# Patient Record
Sex: Female | Born: 1980 | Race: White | Hispanic: No | Marital: Married | Smoking: Former smoker
Health system: Southern US, Community
[De-identification: ages and names within clinical notes are randomized; demographics above are authoritative.]

## PROBLEM LIST (undated history)

## (undated) ENCOUNTER — Inpatient Hospital Stay (HOSPITAL_COMMUNITY): Payer: Self-pay

## (undated) DIAGNOSIS — O09529 Supervision of elderly multigravida, unspecified trimester: Secondary | ICD-10-CM

## (undated) DIAGNOSIS — Z87442 Personal history of urinary calculi: Secondary | ICD-10-CM

## (undated) DIAGNOSIS — R87629 Unspecified abnormal cytological findings in specimens from vagina: Secondary | ICD-10-CM

## (undated) HISTORY — DX: Personal history of urinary calculi: Z87.442

## (undated) HISTORY — PX: COLPOSCOPY: SHX161

## (undated) HISTORY — PX: DILATION AND EVACUATION: SHX1459

## (undated) HISTORY — DX: Unspecified abnormal cytological findings in specimens from vagina: R87.629

---
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
## 2000-12-16 ENCOUNTER — Other Ambulatory Visit: Admission: RE | Admit: 2000-12-16 | Discharge: 2000-12-16 | Payer: Self-pay | Admitting: *Deleted

## 2002-11-17 ENCOUNTER — Emergency Department (HOSPITAL_COMMUNITY): Admission: EM | Admit: 2002-11-17 | Discharge: 2002-11-17 | Payer: Self-pay | Admitting: Emergency Medicine

## 2002-11-17 ENCOUNTER — Encounter: Payer: Self-pay | Admitting: Emergency Medicine

## 2004-09-11 ENCOUNTER — Emergency Department (HOSPITAL_COMMUNITY): Admission: EM | Admit: 2004-09-11 | Discharge: 2004-09-12 | Payer: Self-pay | Admitting: Emergency Medicine

## 2011-07-03 ENCOUNTER — Other Ambulatory Visit (HOSPITAL_COMMUNITY): Payer: Self-pay | Admitting: Gynecology

## 2011-07-03 DIAGNOSIS — Z3141 Encounter for fertility testing: Secondary | ICD-10-CM

## 2011-07-09 ENCOUNTER — Ambulatory Visit (HOSPITAL_COMMUNITY)
Admission: RE | Admit: 2011-07-09 | Discharge: 2011-07-09 | Disposition: A | Discharge status: Home / Self Care | Payer: BC Managed Care – PPO | Source: Ambulatory Visit | Attending: Gynecology | Admitting: Gynecology

## 2011-07-09 DIAGNOSIS — N979 Female infertility, unspecified: Secondary | ICD-10-CM | POA: Insufficient documentation

## 2011-07-09 DIAGNOSIS — Z3141 Encounter for fertility testing: Secondary | ICD-10-CM

## 2011-07-09 MED ORDER — IOHEXOL 300 MG/ML  SOLN: 8.0000 mL | Freq: Once | INTRAMUSCULAR | Status: AC | PRN

## 2012-10-13 LAB — OB RESULTS CONSOLE RPR: RPR: NONREACTIVE

## 2012-10-13 LAB — OB RESULTS CONSOLE RUBELLA ANTIBODY, IGM: Rubella: IMMUNE

## 2012-10-13 LAB — OB RESULTS CONSOLE GC/CHLAMYDIA
CHLAMYDIA, DNA PROBE: NEGATIVE
GC PROBE AMP, GENITAL: NEGATIVE

## 2012-10-13 LAB — OB RESULTS CONSOLE ANTIBODY SCREEN: Antibody Screen: NEGATIVE

## 2012-10-13 LAB — OB RESULTS CONSOLE ABO/RH: RH Type: POSITIVE

## 2012-10-13 LAB — OB RESULTS CONSOLE HIV ANTIBODY (ROUTINE TESTING): HIV: NONREACTIVE

## 2012-10-13 LAB — OB RESULTS CONSOLE HEPATITIS B SURFACE ANTIGEN: Hepatitis B Surface Ag: NEGATIVE

## 2013-01-13 ENCOUNTER — Other Ambulatory Visit (HOSPITAL_COMMUNITY): Payer: Self-pay | Admitting: Obstetrics & Gynecology

## 2013-01-13 DIAGNOSIS — O09812 Supervision of pregnancy resulting from assisted reproductive technology, second trimester: Secondary | ICD-10-CM

## 2013-01-16 ENCOUNTER — Ambulatory Visit (HOSPITAL_COMMUNITY)
Admission: RE | Admit: 2013-01-16 | Discharge: 2013-01-16 | Disposition: A | Discharge status: Home / Self Care | Payer: BC Managed Care – PPO | Source: Ambulatory Visit | Attending: Obstetrics & Gynecology | Admitting: Obstetrics & Gynecology

## 2013-01-16 ENCOUNTER — Ambulatory Visit (HOSPITAL_COMMUNITY): Admission: RE | Admit: 2013-01-16 | Payer: BC Managed Care – PPO | Source: Ambulatory Visit

## 2013-01-16 ENCOUNTER — Encounter (HOSPITAL_COMMUNITY): Payer: Self-pay

## 2013-01-16 DIAGNOSIS — Z363 Encounter for antenatal screening for malformations: Secondary | ICD-10-CM | POA: Insufficient documentation

## 2013-01-16 DIAGNOSIS — O358XX0 Maternal care for other (suspected) fetal abnormality and damage, not applicable or unspecified: Secondary | ICD-10-CM | POA: Insufficient documentation

## 2013-01-16 DIAGNOSIS — O09819 Supervision of pregnancy resulting from assisted reproductive technology, unspecified trimester: Secondary | ICD-10-CM | POA: Insufficient documentation

## 2013-01-16 DIAGNOSIS — Z1389 Encounter for screening for other disorder: Secondary | ICD-10-CM | POA: Insufficient documentation

## 2013-01-16 DIAGNOSIS — O09812 Supervision of pregnancy resulting from assisted reproductive technology, second trimester: Secondary | ICD-10-CM

## 2013-01-22 ENCOUNTER — Inpatient Hospital Stay (HOSPITAL_COMMUNITY)
Admission: AD | Admit: 2013-01-22 | Discharge: 2013-01-29 | DRG: 782 | Disposition: A | Discharge status: Home / Self Care | Payer: BC Managed Care – PPO | Source: Ambulatory Visit | Attending: Obstetrics and Gynecology | Admitting: Obstetrics and Gynecology

## 2013-01-22 ENCOUNTER — Encounter (HOSPITAL_COMMUNITY): Payer: Self-pay

## 2013-01-22 ENCOUNTER — Inpatient Hospital Stay (HOSPITAL_COMMUNITY): Payer: BC Managed Care – PPO

## 2013-01-22 DIAGNOSIS — O4590 Premature separation of placenta, unspecified, unspecified trimester: Principal | ICD-10-CM | POA: Diagnosis present

## 2013-01-22 DIAGNOSIS — O321XX0 Maternal care for breech presentation, not applicable or unspecified: Secondary | ICD-10-CM | POA: Diagnosis present

## 2013-01-22 DIAGNOSIS — N939 Abnormal uterine and vaginal bleeding, unspecified: Secondary | ICD-10-CM

## 2013-01-22 LAB — URINALYSIS, ROUTINE W REFLEX MICROSCOPIC
Ketones, ur: NEGATIVE mg/dL
Leukocytes, UA: NEGATIVE
Nitrite: NEGATIVE
Specific Gravity, Urine: 1.01 (ref 1.005–1.030)
pH: 7.5 (ref 5.0–8.0)

## 2013-01-22 LAB — URINE MICROSCOPIC-ADD ON

## 2013-01-22 LAB — CBC
HCT: 34.3 % — ABNORMAL LOW (ref 36.0–46.0)
Hemoglobin: 11.5 g/dL — ABNORMAL LOW (ref 12.0–15.0)
MCHC: 33.5 g/dL (ref 30.0–36.0)
Platelets: 204 10*3/uL (ref 150–400)

## 2013-01-22 LAB — TYPE AND SCREEN: Antibody Screen: NEGATIVE

## 2013-01-22 MED ORDER — ACETAMINOPHEN 325 MG PO TABS: 650.0000 mg | ORAL_TABLET | ORAL | Status: DC | PRN

## 2013-01-22 MED ORDER — BETAMETHASONE SOD PHOS & ACET 6 (3-3) MG/ML IJ SUSP
12.0000 mg | INTRAMUSCULAR | Status: AC
  Administered 2013-01-22 – 2013-01-23 (×2): 12 mg via INTRAMUSCULAR
  Filled 2013-01-22 (×2): qty 2

## 2013-01-22 MED ORDER — PRENATAL MULTIVITAMIN CH
1.0000 | ORAL_TABLET | Freq: Every day | ORAL | Status: DC
  Administered 2013-01-23 – 2013-01-29 (×7): 1 via ORAL
  Filled 2013-01-22 (×7): qty 1

## 2013-01-22 MED ORDER — ZOLPIDEM TARTRATE 5 MG PO TABS: 5.0000 mg | ORAL_TABLET | Freq: Every evening | ORAL | Status: DC | PRN

## 2013-01-22 MED ORDER — CALCIUM CARBONATE ANTACID 500 MG PO CHEW: 2.0000 | CHEWABLE_TABLET | ORAL | Status: DC | PRN

## 2013-01-22 MED ORDER — SODIUM CHLORIDE 0.9 % IJ SOLN
3.0000 mL | Freq: Two times a day (BID) | INTRAMUSCULAR | Status: DC
  Administered 2013-01-22 – 2013-01-29 (×14): 3 mL via INTRAVENOUS

## 2013-01-22 MED ORDER — DOCUSATE SODIUM 100 MG PO CAPS
100.0000 mg | ORAL_CAPSULE | Freq: Every day | ORAL | Status: DC
  Administered 2013-01-23 – 2013-01-29 (×7): 100 mg via ORAL
  Filled 2013-01-22 (×7): qty 1

## 2013-01-22 NOTE — H&P (Addendum)
HPI   32 y.o. [redacted]w[redacted]d Transferred to MAU from [facility] with painless VB. She awoke this morning with vaginal bleeding - bright red in her underwear and then had a "flow" of vaginal bleeding while on the toilet. Called 911 and was taken to [facility]. Was transported to MAU as client needs an ultrasound. Is feeling the baby move. FHT printing on fetal monitor. Pt denies contractions.  VB now just spotting.  OB History    Grav  Para  Term  Preterm  Abortions  TAB  SAB  Ect  Mult  Living    3     2  1  1          History reviewed. No pertinent past medical history.  History reviewed. No pertinent past surgical history.  History reviewed. No pertinent family history.  History   Substance Use Topics   .  Smoking status:  Never Smoker   .  Smokeless tobacco:  Not on file   .  Alcohol Use:  No    Allergies: No Known Allergies  Prescriptions prior to admission   Medication  Sig  Dispense  Refill   .  Prenatal MV-Min-Fe Fum-FA-DHA (PRENATAL+DHA PO)  Take 1 tablet by mouth daily.      Review of Systems  Constitutional: Negative for fever.  Gastrointestinal: Negative for nausea, vomiting and abdominal pain.  Genitourinary: Negative for dysuria.  Vaginal bleeding.  No vaginal leaking.   Physical Exam   Blood pressure 109/76, pulse 75, temperature 97.9 F (36.6 C), temperature source Oral, resp. rate 18, + FHT  Physical Exam  Nursing note and vitals reviewed.  Constitutional: She is oriented to person, place, and time. She appears well-developed and well-nourished. No distress.  Head: Normocephalic.  Eyes: EOM are normal.  Neck: Neck supple.  GI: Soft. There is no tenderness.  Musculoskeletal: Normal range of motion.  Neurological: She is alert and oriented to person, place, and time.  Skin: Skin is warm and dry.  Psychiatric: She has a normal mood and affect.   MAU Course      CBC done at [facility] - results reviewed. - WBC 10.0, RBC 3.92, HGB  11.9, HCT 35.4, Neutrophils 72.7, Lymphocytes 19.3   [place] - breech presentation.  cvx 4cm.  3.9cm clot noted above cervical os.     Rh + blood type  Assessment and Plan   IUP at 24+3 wks, IVF pregnancy with Marginal placental abruption.  Admit BMZ Fetal monitoring Bedrest Repeat CBC in am

## 2013-01-22 NOTE — MAU Note (Signed)
Pt presents with complaints of bright red vaginal bleeding saturating a pad that started this morning. Bleeding has since slowed down and pt states she is spotting. Denies pain, vaginal discharge and leaking of fluid

## 2013-01-22 NOTE — MAU Provider Note (Signed)
  History     CSN: [id number]  Arrival date and time: 01/22/13 1049   Seen by provider at 1115 am    Chief Complaint  Patient presents with  . Vaginal Bleeding   HPI Jennifer [name] 32 y.o. [redacted]w[redacted]d Comes to MAU after being at [facility] this AM.  She awakened this morning and had some vaginal bleeding - bright red in her underwear and then had a "flow" of vaginal bleeding while on the toilet.  Called 911 and was taken to [facility].  Was transported to MAU as client needs an ultrasound.  Is feeling the baby move.  FHT printing on fetal monitor.  Client does not report any contractions.  OB History   Grav Para Term Preterm Abortions TAB SAB Ect Mult Living   3    2 1 1          History reviewed. No pertinent past medical history.  History reviewed. No pertinent past surgical history.  History reviewed. No pertinent family history.  History  Substance Use Topics  . Smoking status: Never Smoker   . Smokeless tobacco: Not on file  . Alcohol Use: No    Allergies: No Known Allergies  Prescriptions prior to admission  Medication Sig Dispense Refill  . Prenatal MV-Min-Fe Fum-FA-DHA (PRENATAL+DHA PO) Take 1 tablet by mouth daily.        Review of Systems  Constitutional: Negative for fever.  Gastrointestinal: Negative for nausea, vomiting and abdominal pain.  Genitourinary: Negative for dysuria.       Vaginal bleeding. No vaginal leaking.   Physical Exam   Blood pressure 109/76, pulse 75, temperature 97.9 F (36.6 C), temperature source Oral, resp. rate 18, last menstrual period 08/04/2012.  Physical Exam  Nursing note and vitals reviewed. Constitutional: She is oriented to person, place, and time. She appears well-developed and well-nourished. No distress.  HENT:  Head: Normocephalic.  Eyes: EOM are normal.  Neck: Neck supple.  GI: Soft. There is no tenderness.  Musculoskeletal: Normal range of motion.  Neurological: She is alert and oriented  to person, place, and time.  Skin: Skin is warm and dry.  Psychiatric: She has a normal mood and affect.    MAU Course  Procedures  MDM 1125 Consult with Dr. Renaldo Fiddler re: plan of care  CBC done at [facility] - results reviewed.  -  WBC 10.0, RBC 3.92, HGB 11.9, HCT 35.4, Neutrophils 72.7, Lymphocytes 19.3  1252  Radiologist called  - marginal placental abruption. 1255  Dr. Renaldo Fiddler notified.  Ultrasound report reviewed.  Assessment and Plan  Marginal placental abruption.  Plan Will admit. Dr. Renaldo Fiddler to see patient this afternoon.   BURLESON,TERRI 01/22/2013, 1:02 PM

## 2013-01-23 ENCOUNTER — Inpatient Hospital Stay (HOSPITAL_COMMUNITY): Payer: BC Managed Care – PPO

## 2013-01-23 ENCOUNTER — Ambulatory Visit (HOSPITAL_COMMUNITY): Payer: BC Managed Care – PPO

## 2013-01-23 LAB — CBC
HCT: 33.5 % — ABNORMAL LOW (ref 36.0–46.0)
Hemoglobin: 11.3 g/dL — ABNORMAL LOW (ref 12.0–15.0)
MCH: 29.7 pg (ref 26.0–34.0)
MCV: 87.9 fL (ref 78.0–100.0)
RBC: 3.81 MIL/uL — ABNORMAL LOW (ref 3.87–5.11)
WBC: 15.6 10*3/uL — ABNORMAL HIGH (ref 4.0–10.5)

## 2013-01-23 LAB — ABO/RH: ABO/RH(D): A POS

## 2013-01-23 NOTE — Progress Notes (Signed)
Pt sitting up eating breakfast.

## 2013-01-23 NOTE — Consult Note (Signed)
Maternal Fetal Medicine Consultation  Requesting Provider(s): Candice Camp, MD  Reason for consultation: Marginal placental abruption  HPI: Jennifer [name] is a 32 yo G3P0020 currently at 24 4/7 weeks by IVF dates currently admitted due to episode of vaginal bleeding yesterday.  She reports some heavy bleeding yesterday AM - enough to turn the toilet water bloody - has since resolved, but continues to pass some brownish vaginal discharge without active bleeding.  She experienced some cramping initially that has since resolved.  The fetus is active.  She denies any abdominal pain or contractions now.  [name] reports some vaginal bleeding and subchorionic hemorrhage during the first trimester which resolved.  Her prenatal course is otherwise uncomplicated.  OB History: OB History   Grav Para Term Preterm Abortions TAB SAB Ect Mult Living   3    2 1 1          PMH: History reviewed. No pertinent past medical history.  PSH: History reviewed. No pertinent past surgical history.  Meds:  Scheduled Meds: . betamethasone acetate-betamethasone sodium phosphate  12 mg Intramuscular Q24H  . docusate sodium  100 mg Oral Daily  . prenatal multivitamin  1 tablet Oral Q1200  . sodium chloride  3 mL Intravenous Q12H   Continuous Infusions:  PRN Meds:.acetaminophen, calcium carbonate, zolpidem  Allergies: No Known Allergies  FH: denies family history of birth defects or hereditary disorders  Soc: denies tobacco or alcohol use during pregnancy  Review of Systems: no vaginal bleeding or cramping/contractions, no LOF, no nausea/vomiting. All other systems reviewed and are negative.  PNL: Blood type A+   PE:   Filed Vitals:   01/23/13 1239  BP:   Pulse:   Temp: 98.4 F (36.9 C)  Resp: 18    GEN: well-appearing female ABD: gravid, NT  Ultrasound: Single IUP at 24 4/7 weeks Limited ultrasound performed - no retroplacental fluid collections noted A 3.5 x 1.3 x 2.9 cm clot is noted at  the internal os - consistent with a resolving marginal placental abruption (stable since admission) Normal amniotic fluid volume TVUS - cervical length 3.9 cm without funneling or dynamic changes.  Labs: CBC    Component Value Date/Time   WBC 15.6* 01/23/2013 0555   RBC 3.81* 01/23/2013 0555   HGB 11.3* 01/23/2013 0555   HCT 33.5* 01/23/2013 0555   PLT 229 01/23/2013 0555   MCV 87.9 01/23/2013 0555   MCH 29.7 01/23/2013 0555   MCHC 33.7 01/23/2013 0555   RDW 12.9 01/23/2013 0555     A/P: 1) Single IUP at 24 4/7 weeks         2) Suspected marginal abruption - the patient denies any current, active vaginal bleeding.  Her repeat CBC has been stable.  Ultrasound findings were reviewed with the patient and expected hospital course discussed.  Recommendations: 1) Based on the degree of vaginal bleeding that the patient reports, recommend inpatient observation for at least 5-7 days without active vaginal bleeding prior to hospital discharge. 2) Concur with course of betamethasone 3) If the patient were to develop any additional bleeding, would offer NICU consult.  If any significant clinical change occurs and delivery is felt to be imminent, would also give a 12 hour course of Magnesium sulfate for neuroprotection. 4) Recommend follow up ultrasound in 2-3 weeks for interval growth and to complete anatomy.   Thank you for the opportunity to be a part of the care of Jennifer [name]. Please contact our office if we can  be of further assistance.   I spent approximately 30 minutes with this patient with over 50% of time spent in face-to-face counseling.  Alpha Gula, MD Materna-Fetal Medicine

## 2013-01-23 NOTE — Progress Notes (Addendum)
Patient ID: Jennifer [name], female   DOB: February 16, 1981, 32 y.o.   MRN: [id number] Pt without complaints GFM Brown bleeding today No Ctxs  VSSAF Hgb 11.3 (stable) FHR cat 1 No Ctxs  Abd Gravid nt   IUP at 24 4/7 Vaginal Bleeding.  Stable.  Marginal abruption Currently in BMZ series Anticipate hospitalization for 1 week after bleeding stops MFM u/s consult today DL

## 2013-01-24 LAB — CULTURE, BETA STREP (GROUP B ONLY)

## 2013-01-24 NOTE — Progress Notes (Signed)
Patient ID: Jennifer [name], female   DOB: 06-Aug-1980, 32 y.o.   MRN: [id number] S: MINIMAL SPOTTING O: AF VSS      GRAVID UTERUS NONTENDER      FHR 140'S NO DECELS NO CTXS A: IUP AT 24.5 WITH MARGINAL ABRUPTION P: CONTINUE REST

## 2013-01-25 LAB — TYPE AND SCREEN: Antibody Screen: NEGATIVE

## 2013-01-25 NOTE — Progress Notes (Signed)
Pt off the monitor after reassurring FHR  

## 2013-01-25 NOTE — Progress Notes (Signed)
24 6/7 weeks Brown vaginal spotting yesterday, none yet today No BRB/cramping  VSS Afeb Uterus soft NT  FHT + UCs none on monitor today  A: marginal abruption     S/P BMTZ series  P: continue present care

## 2013-01-25 NOTE — Progress Notes (Signed)
Pt taken off the monitor.

## 2013-01-26 NOTE — Progress Notes (Signed)
No c/o.  No VB since Sunday.  No pain.  +FM.    VSS.  AF. FHT reactive Toco flat  Gen: A&O x 3 Abd: soft, NT Ext: no c/c/e  32yo G3P0 at [redacted]w[redacted]d with marginal abruption -s/p BMZ -Plan to keep in-pt x 7 days with no active bleeding -Continue bed rest

## 2013-01-27 NOTE — Progress Notes (Signed)
No c/o. No VB since Sunday. No pain. +FM.   VSS. AF.  FHT reactive  Toco flat   Gen: A&O x 3  Abd: soft, NT  Ext: no c/c/e   32yo G3P0 at [redacted]w[redacted]d with marginal abruption  -s/p BMZ  -Plan to keep in-pt x 7 days with no active bleeding  -Continue bed rest

## 2013-01-28 LAB — TYPE AND SCREEN
ABO/RH(D): A POS
Antibody Screen: NEGATIVE

## 2013-01-28 NOTE — Progress Notes (Signed)
No c/o. No VB since Sunday. No pain. +FM.   VSS. AF.  FHT reactive  Toco flat   Gen: A&O x 3  Abd: soft, NT  Ext: no c/c/e   32yo G3P0 at [redacted]w[redacted]d with marginal abruption  -s/p BMZ  -Plan to discharge tomorrow if no active bleeding -Continue bed rest

## 2013-01-28 NOTE — Progress Notes (Signed)
Pt off the monitor after reassurring FHR  

## 2013-01-29 ENCOUNTER — Inpatient Hospital Stay (HOSPITAL_COMMUNITY): Payer: BC Managed Care – PPO

## 2013-01-29 NOTE — Progress Notes (Signed)
Discharge home with patient stating and understanding via teach back method. Pt to follow up later this week.

## 2013-01-29 NOTE — Progress Notes (Signed)
Preliminary ultrasound report shows stable clot and no new abnormalities.  Will d/c patient home with f/u next week.

## 2013-01-29 NOTE — Discharge Summary (Signed)
Obstetric Discharge Summary Reason for Admission: marginal abruption Prenatal Procedures: none Intrapartum Procedures: n/a Postpartum Procedures: none Complications-Operative and Postpartum: n/a Hemoglobin  Date Value Range Status  01/23/2013 11.3* 12.0 - 15.0 g/dL Final     HCT  Date Value Range Status  01/23/2013 33.5* 36.0 - 46.0 % Final    Physical Exam:  General: alert, cooperative and appears stated age 32: n/a Uterine Fundus: soft Incision: n/a DVT Evaluation: No evidence of DVT seen on physical exam. Negative Homan's sign. No cords or calf tenderness.  Discharge Diagnoses: marginal abruption  Discharge Information: Date: 01/29/2013 Activity: pelvic rest and bed rest Diet: routine Medications: PNV Condition: stable Instructions: refer to practice specific booklet Discharge to: home   Newborn Data: <<This patient has not yet delivered during this pregnancy.>> Home with n/a.  Jennifer [name] 01/29/2013, 5:50 PM

## 2013-01-29 NOTE — Progress Notes (Signed)
No c/o. No VB since Sunday. No pain. +FM.   VSS. AF.  FHT reactive  Toco flat   Gen: A&O x 3  Abd: soft, NT  Ext: no c/c/e   32yo G3P0 at [redacted]w[redacted]d with marginal abruption  -s/p BMZ  -Rpt U/S to look at abruption prior to D/C -If ultrasound reassuring, D/C home with bedrest and office follow-up next week -Growth and completion of 11/24 scan in 1-2 weeks

## 2013-01-30 ENCOUNTER — Encounter (HOSPITAL_COMMUNITY): Payer: Self-pay | Admitting: *Deleted

## 2013-01-30 ENCOUNTER — Inpatient Hospital Stay (HOSPITAL_COMMUNITY): Payer: BC Managed Care – PPO

## 2013-01-30 ENCOUNTER — Inpatient Hospital Stay (HOSPITAL_COMMUNITY)
Admission: AD | Admit: 2013-01-30 | Discharge: 2013-02-16 | DRG: 782 | Disposition: A | Discharge status: Home / Self Care | Payer: BC Managed Care – PPO | Source: Ambulatory Visit | Attending: Obstetrics and Gynecology | Admitting: Obstetrics and Gynecology

## 2013-01-30 DIAGNOSIS — O321XX0 Maternal care for breech presentation, not applicable or unspecified: Secondary | ICD-10-CM | POA: Diagnosis present

## 2013-01-30 DIAGNOSIS — O09819 Supervision of pregnancy resulting from assisted reproductive technology, unspecified trimester: Secondary | ICD-10-CM

## 2013-01-30 DIAGNOSIS — O4590 Premature separation of placenta, unspecified, unspecified trimester: Principal | ICD-10-CM | POA: Diagnosis present

## 2013-01-30 DIAGNOSIS — O4692 Antepartum hemorrhage, unspecified, second trimester: Secondary | ICD-10-CM

## 2013-01-30 DIAGNOSIS — O2690 Pregnancy related conditions, unspecified, unspecified trimester: Secondary | ICD-10-CM

## 2013-01-30 DIAGNOSIS — O4690 Antepartum hemorrhage, unspecified, unspecified trimester: Secondary | ICD-10-CM | POA: Diagnosis present

## 2013-01-30 LAB — CBC
HCT: 35.9 % — ABNORMAL LOW (ref 36.0–46.0)
Hemoglobin: 12.1 g/dL (ref 12.0–15.0)
MCHC: 33.7 g/dL (ref 30.0–36.0)
MCV: 88.9 fL (ref 78.0–100.0)
RDW: 13 % (ref 11.5–15.5)

## 2013-01-30 LAB — TYPE AND SCREEN
ABO/RH(D): A POS
Antibody Screen: NEGATIVE

## 2013-01-30 MED ORDER — ACETAMINOPHEN 325 MG PO TABS: 650.0000 mg | ORAL_TABLET | ORAL | Status: DC | PRN

## 2013-01-30 MED ORDER — DOCUSATE SODIUM 100 MG PO CAPS
100.0000 mg | ORAL_CAPSULE | Freq: Every day | ORAL | Status: DC
  Administered 2013-01-30 – 2013-02-16 (×18): 100 mg via ORAL
  Filled 2013-01-30 (×19): qty 1

## 2013-01-30 MED ORDER — ZOLPIDEM TARTRATE 5 MG PO TABS: 5.0000 mg | ORAL_TABLET | Freq: Every evening | ORAL | Status: DC | PRN

## 2013-01-30 MED ORDER — CALCIUM CARBONATE ANTACID 500 MG PO CHEW
2.0000 | CHEWABLE_TABLET | ORAL | Status: DC | PRN
  Filled 2013-01-30: qty 2

## 2013-01-30 MED ORDER — PRENATAL MULTIVITAMIN CH
1.0000 | ORAL_TABLET | Freq: Every day | ORAL | Status: DC
  Administered 2013-01-30 – 2013-02-16 (×18): 1 via ORAL
  Filled 2013-01-30 (×19): qty 1

## 2013-01-30 MED ORDER — LACTATED RINGERS IV SOLN
INTRAVENOUS | Status: DC
  Administered 2013-01-30 – 2013-02-01 (×6): via INTRAVENOUS

## 2013-01-30 NOTE — H&P (Signed)
Jennifer [name]  DICTATION # [id number] CSN# [id number]   Meriel Pica, MD 01/30/2013 9:41 AM

## 2013-01-30 NOTE — H&P (Signed)
NAMEZAYLEI, MULLANE NO.:  [id number]  MEDICAL RECORD NO.:  [id number]  LOCATION:  [facility]                          FACILITY:  [facility]  PHYSICIAN:  Duke Salvia. Marcelle Overlie, M.D.DATE OF BIRTH:  02-Aug-1980  DATE OF ADMISSION:  01/30/2013 DATE OF DISCHARGE:                             HISTORY & PHYSICAL   CHIEF COMPLAINT:  Vaginal bleeding.  HISTORY OF PRESENT ILLNESS:  A 32 year old, G3, P 2-1-1-0 at 24+ weeks gestation, this patient was just discharged from the hospital yesterday after being admitted on November 23 for marginal separation.  She was observed in-house after receiving betamethasone during that time, with normal cervical length.  Ultrasound showing breech presentation, with resolution of her bleeding.  She came back to MAU with complaints today of increased bleeding and is admitted now for further observation.  IV was started.  CBC type and screen, and follow up ultrasound done in MAU prior to admission.  She was evaluated by Dr. Harlon Flor from MFM last week when she was here.  PAST MEDICAL HISTORY:  Please see the [facility] form for details.  PHYSICAL EXAMINATION:  VITAL SIGNS:  Temp 98.2, blood pressure 120/72. HEENT:  Unremarkable. NECK:  Supple without masses. LUNGS:  Clear. CARDIOVASCULAR:  Regular rate and rhythm without murmurs, rubs, gallops. BREASTS:  Not examined. PELVIC:  24 cm fundal height.  The fundus was nontender.  Fetal heart rate 148.  Some dark blood from the cervix was noted by exam.  Cervix was closed. EXTREMITIES:  Unremarkable. NEUROLOGIC:  Unremarkable.  IMPRESSION: 1. A 32 plus week gestation. 2. Breech presentation. 3. In Vitro Fertilization pregnancy with marginal separation.  PLAN:  We will admit for further continuous monitoring, will plan to observe if stable, if bleeding continues, or fetal intolerance to labor, consider delivery by cesarean section.     Kimmi Acocella M. Marcelle Overlie, M.D.     RMH/MEDQ  D:  01/30/2013   T:  01/30/2013  Job:  [id number]

## 2013-01-30 NOTE — MAU Provider Note (Signed)
  History     CSN: [id number]  Arrival date and time: 01/30/13 0448   None     Chief Complaint  Patient presents with  . Vaginal Bleeding   HPI  Jennifer [name] is a 32 y.o. female G3P0020 at [redacted]w[redacted]d who presents with recurring vaginal bleeding. Pt was admitted to [facility] for bleeding last week, US showed marginal abruption. Pt was sent home yesterday and awoke this morning with bright red vaginal bleeding. She denies pain at this time. Pt has been on bed rest and pelvic rest since the start of the vaginal bleeding. Compared to the bleeding she had last week, pt describes the bleeding as a more steady stream of blood.   OB History   Grav Para Term Preterm Abortions TAB SAB Ect Mult Living   3    2 1 1          History reviewed. No pertinent past medical history.  History reviewed. No pertinent past surgical history.  History reviewed. No pertinent family history.  History  Substance Use Topics  . Smoking status: Never Smoker   . Smokeless tobacco: Not on file  . Alcohol Use: No    Allergies: No Known Allergies  Prescriptions prior to admission  Medication Sig Dispense Refill  . Prenatal MV-Min-Fe Fum-FA-DHA (PRENATAL+DHA PO) Take 1 tablet by mouth daily.       No results found for this or any previous visit (from the past 24 hour(s)).     Review of Systems  Constitutional: Negative for fever and chills.  Gastrointestinal: Negative for nausea, vomiting and abdominal pain.  Genitourinary: Negative for dysuria, urgency, frequency and hematuria.       No vaginal discharge. + vaginal bleeding. No dysuria.    Physical Exam   Blood pressure 135/66, pulse 79, temperature 98.7 F (37.1 C), temperature source Oral, resp. rate 18, height 5\' 6"  (1.676 m), weight 84.823 kg (187 lb), last menstrual period 08/04/2012, SpO2 100.00%.  Physical Exam  Constitutional: She is oriented to person, place, and time. She appears well-developed and well-nourished. No distress.  HENT:   Head: Normocephalic.  Eyes: Pupils are equal, round, and reactive to light.  Neck: Neck supple.  Respiratory: Effort normal.  GI: Soft.  Genitourinary:  Speculum exam: Vagina - Large blood clot near cervix, difficult to visualize.  Cervix -unable to visualize  Bimanual exam: Deferred  Chaperone present for exam. Large amount of bright red blood on perineum     Neurological: She is alert and oriented to person, place, and time.  Skin: Skin is warm. She is not diaphoretic. There is pallor.    Fetal Tracing: Baseline: 145 bpm  Variability: moderate  Accelerations: 10x10 Decelerations: None  Toco: No contractions      MAU Course  Procedures None  MDM CBC LR [place] Admit to [facility] per Dr. Marcelle Overlie.   Assessment and Plan   A: Vaginal bleeding in pregnancy Marginal abruption  P: Re-admit to [facility] per Dr. Kennyth Lose, NP 01/30/2013, 8:27 AM

## 2013-01-30 NOTE — MAU Note (Signed)
Pt reports she was discharged home last pm after being in the hospital x one week for episode of vaginal bleeding one week ago (abruption), on bleeding during stay in hospital but had onset of vaginal bleeding tonight.

## 2013-01-31 LAB — CBC
HCT: 32.7 % — ABNORMAL LOW (ref 36.0–46.0)
Hemoglobin: 10.9 g/dL — ABNORMAL LOW (ref 12.0–15.0)
MCH: 29.9 pg (ref 26.0–34.0)
MCHC: 33.3 g/dL (ref 30.0–36.0)
MCV: 89.6 fL (ref 78.0–100.0)
RBC: 3.65 MIL/uL — ABNORMAL LOW (ref 3.87–5.11)

## 2013-01-31 NOTE — Progress Notes (Signed)
Patient ID: Jennifer [name], female   DOB: 21-Aug-1980, 32 y.o.   MRN: [id number] Pt still reports brown bleeding today.  Much less. More spotty GFM No Ctxs  VSSAF FHR Cat 1 Ctx none  Abd Gravid nt  IUP at 25 5/7 Vaginal Bleeding/Marginal abruption - still with spotting/stable [place] clot and hgb S/P BMZ Continue hospitalization for at least 1 week after bleeding stops [place] MFM tomorrow for EFW Breech - C/S for delivery

## 2013-02-01 ENCOUNTER — Inpatient Hospital Stay (HOSPITAL_COMMUNITY): Payer: BC Managed Care – PPO

## 2013-02-01 ENCOUNTER — Ambulatory Visit (HOSPITAL_COMMUNITY): Payer: BC Managed Care – PPO

## 2013-02-01 MED ORDER — SODIUM CHLORIDE 0.9 % IJ SOLN
3.0000 mL | Freq: Two times a day (BID) | INTRAMUSCULAR | Status: DC
  Administered 2013-02-01 – 2013-02-02 (×4): 3 mL via INTRAVENOUS

## 2013-02-01 NOTE — Progress Notes (Signed)
To MFM

## 2013-02-01 NOTE — Progress Notes (Signed)
Pt denies vb - just dark brown d/c with wiping.  No pain or ctx.  + FM  AF, VSS Gen - NAD Abd - gravid, NT  A/P:  Abruption, 25+6 wks Continue bedrest, s/p BMZ [place] scheduled for today Breech - c-section if delivery indicated Ok to saline lock IV

## 2013-02-01 NOTE — Consult Note (Signed)
Jennifer [name] was discharged this past Sunday after spending one week on the antenatal unit for her first episode of vaginal bleeding from an abruption. After ~ 9 hours at home, she began bleeding again - not as heavy as before but lasted longer (~ 8-9 hours). Since her second admission, there has been dark blood on the tissue when she wipes. She denies abdominal pain and decreased fetal movement.    [place] today:  IUP at 25+6 weeks Normal interval anatomy; anatomic survey complete Normal amniotic fluid volume  Appropriate interval growth with EFW at the 51st %tile [facility] again seen over cervical area measuring 2.7 x 2.7 x 1.6 cm  Assessment: 1) Chronic abruption - second bleed; 2) IUP at 25+6 weeks; 3) Appropriate growth; 4) Frank breech presentation    Recommendations:    1) Please see Dr. Fredda Hammed note from 11/24 2) Would consider hospitalization until ~ [redacted] weeks gestation 3) Magnesium for neuropx if delivery appears imminent 4) Growth [place] in 3-4 weeks  Please call with questions or concerns.  (Face-to-face consultation with patient: 30 min)

## 2013-02-02 NOTE — Progress Notes (Signed)
Patient ID: Jennifer [name], female   DOB: 13-Dec-1980, 32 y.o.   MRN: [id number] S: MINIMAL SPOTTING NO CONTRACTIONS GOOD FETAL MOVEMENT O:  AF VSS       GRAVID UTERUS NONTENDER       FHR LAST PM 140'S REASSURING NO CONTRACTIONS       SONO FRANK BREECH NORMAL GROWTH AND AFV  CLOT NOTED AT CERVIX C/W        CHRONIC ABRUPTION A:   IUP AT 26 WEEKS       CHRONIC ABRUPTION       BREECH P: CONTINUE HOSPITALIZATION TILL 28 WEEK AT LEAST.

## 2013-02-03 LAB — TYPE AND SCREEN
ABO/RH(D): A POS
Antibody Screen: NEGATIVE

## 2013-02-03 NOTE — Progress Notes (Signed)
Patient is doing well No active bleeding No  Contractions  Afebrile VSS General alert and oriented Abdomen is soft and non tender  IMPRESSION IUP at 26 weeks and 1 day Chronic Abruption Breech  PLAN: Bedrest  Needs to schedule 1 hour PG - Patient wants to do on Dec 11

## 2013-02-04 NOTE — Progress Notes (Signed)
Patient is doing well. No vaginal bleeding. No contractions. Reports good fetal movement.  Afebrile VSS General alert and oriented Lung CTAB Car RRR Abdomen non tender  IMPRESSION: IUP at 26 weeks and 2 days Chronic Abruption Breech  PLAN: Bedrest  Schedule 1 hour PG next week

## 2013-02-05 NOTE — Progress Notes (Signed)
Patient has had a quiet weekend. No bleeding. Good Fetal movement.  Afebrile VSS General alert and oriented Abdomen is soft and non tender  IMPRESSION: IUP at 26 weeks and 3 days Chronic Abruption Breech  PLAN: Continue bedrest Patient will need 1 hour glucola  Patient was originally scheduled for glucola on Dec 11

## 2013-02-06 LAB — TYPE AND SCREEN
ABO/RH(D): A POS
Antibody Screen: NEGATIVE

## 2013-02-06 NOTE — Progress Notes (Signed)
I visited with pt while making rounds on the unit.  Jennifer [name] was in good spirits and appears to have good family support. She did not have an specific needs at this time, but she is aware of on-going availability of chaplain support.  [employer] Pager, [phone] 3:40 PM   02/06/13 1500  Clinical Encounter Type  Visited With Patient  Visit Type Spiritual support;Initial  Spiritual Encounters  Spiritual Needs Emotional

## 2013-02-06 NOTE — Progress Notes (Signed)
Ur chart review completed.  

## 2013-02-06 NOTE — Progress Notes (Signed)
No c/o.  +FM.  No VB or CTX.  VSS. Gen: A&O x 3 Abd: soft, NT/ND Ext: no c/c/e  32yo G3P0 at [redacted]w[redacted]d with chronic abruption -In-house until 28 weeks -s/p BMZ -Plan glucola 12/11 -bedrest

## 2013-02-07 NOTE — Progress Notes (Signed)
26 5/7 wks No bleeding No C/O  VSS Afeb Uterus NT FHT + accels  A: Chronic Abruption  P: Continue observation      Reevaluate at 28 wks

## 2013-02-08 NOTE — Progress Notes (Signed)
Patient ID: Jennifer [name], female   DOB: [birth year], 32 y.o.   MRN: [id number] [redacted]w[redacted]d  S//  No new c/o  O//BP 110/58  Pulse 85  Temp(Src) 98.2 F (36.8 C) (Oral)  Resp 18  Ht 5\' 6"  (1.676 m)  Wt 84.868 kg (187 lb 1.6 oz)  BMI 30.21 kg/m2  SpO2 100%  LMP 08/04/2012  Stable FHR  A+P// [redacted]w[redacted]d with marginal sep, S/P BMZ, cont obsv to 28 weeks

## 2013-02-08 NOTE — Progress Notes (Signed)
Antenatal Nutrition Assessment:  Currently  26 6/[redacted] weeks gestation, with vaginal bleeding. Height  66 "  Weight 190 lbs  pre-pregnancy weight 163 lbs .Pre-pregnancy  BMI26.3     IBW130 lbs Total weight gain 27.lbs Weight gain goals 15-25 lbs. Pt has exceeded weight gain limit at 26 weeks Estimated needs: 19-2100 kcal/day, 70-80 grams protein/day, 2.2 liters fluid/day  Antenatal Regular diet tolerated well, appetite good. Snack and retail menus offered Calcium intake on the lower side, encouraged intake of low fat dairy Current diet prescription will provide for increased needs.  No abnormal nutrition related labs   Hemoglobin & Hematocrit     Component Value Date/Time   HGB 10.9* 01/31/2013 0818   HCT 32.7* 01/31/2013 0818     Nutrition Dx: Increased nutrient needs r/t pregnancy and fetal growth requirements aeb [redacted] weeks gestation.  No educational needs assessed at this time.  Elisabeth Cara M.Odis Luster LDN Neonatal Nutrition Support Specialist Pager [phone]

## 2013-02-09 LAB — TYPE AND SCREEN: ABO/RH(D): A POS

## 2013-02-09 NOTE — Progress Notes (Signed)
Pt denies VB & ctx.  + FM  AF, VSS + FHT Abd - gravid, NT  A/P:  Marginal abruption Hospital bedrest ot 28 wks

## 2013-02-10 ENCOUNTER — Inpatient Hospital Stay (HOSPITAL_COMMUNITY): Payer: BC Managed Care – PPO

## 2013-02-10 NOTE — Progress Notes (Signed)
Pt off the monitor after reassurring FHR  

## 2013-02-10 NOTE — Progress Notes (Signed)
Patient ID: Jennifer [name], female   DOB: 12/29/1980, 32 y.o.   MRN: [id number] Pt without complaints GFM  Occas brown vag blood noted VSSAF FHR cat 1 140s Ctxs none Abd Gravid nt  IUP at 27 1/7 Marginal abruption- stable [place] today Current plan is hospitization until no bleeding x 1 week (? 28 weeks) DL

## 2013-02-11 NOTE — Progress Notes (Signed)
Pt off the monitor after reassurring FHR  

## 2013-02-11 NOTE — Progress Notes (Signed)
Pt sitting up in the bed eating breakfast  

## 2013-02-11 NOTE — Progress Notes (Signed)
Patient ID: Jennifer [name], female   DOB: [birth year], 32 y.o.   MRN: [id number] Pt without complaints GFM Occs Brown blood VSSAF FHR 140s Cat 1 Ctx none Abd Gravid nt  Marginal abruption  Stable Reassess at 28 weeks for outpt management [place] yesterday Slight decrease in clot size BPP 6/8 DL

## 2013-02-12 LAB — TYPE AND SCREEN: Antibody Screen: NEGATIVE

## 2013-02-12 NOTE — Progress Notes (Signed)
Patient ID: Jennifer [name], female   DOB: 1980/07/11, 32 y.o.   MRN: [id number] Pt without complaints, still with ocas brown spotting GFM  Occs Brown blood  VSSAF  FHR 140s Cat 1  Ctx none  Abd Gravid nt  Marginal abruption  Stable  Reassess at 28 weeks for outpt management  [place] 2 days ago Slight decrease in clot size

## 2013-02-13 NOTE — Progress Notes (Signed)
Ur chart review completed per request.  

## 2013-02-13 NOTE — Progress Notes (Signed)
27 4/7 weeks No C/O, scant brown D/C  VSS Afeb Uterus soft, NT  FHT with acels   A: marginal abruption-stable  P: Continue present management      Reevaluate at 28 weeks

## 2013-02-14 NOTE — Progress Notes (Signed)
Patient ID: Jennifer [name], female   DOB: [birth year], 32 y.o.   MRN: [id number] S: NO BLEEDING GOOD FETAL MOVEMENT O: AF VSS      GRAVID UTERUS NONTENDER      FHR CAT ONE A:  IUP AT 26.4 MARGINAL ABRUPTIION P:  CONTINUE REST

## 2013-02-15 LAB — TYPE AND SCREEN: Antibody Screen: NEGATIVE

## 2013-02-15 NOTE — Progress Notes (Signed)
S:  Patient is resting overnight. Good fetal movement. No vaginal bleeding.  O:  Afebrile VSS General alert and oriented Abdomen is soft and non tender  IMPRESSION: IUP at 27 weeks and 6 days Chronic Placental Abruption  PLAN: Possible discharge home tomorrow Recommend slowly increase activity today  If no vaginal bleeding, then I would repeat an ultrasound tomorrow prior to discharge.

## 2013-02-16 ENCOUNTER — Inpatient Hospital Stay (HOSPITAL_COMMUNITY): Payer: BC Managed Care – PPO

## 2013-02-16 NOTE — Discharge Summary (Signed)
Physician Discharge Summary  Patient ID: Jennifer [name] MRN: [id number] DOB/AGE: 1980/10/19 32 y.o.  Admit date: 01/30/2013 Discharge date: 02/16/2013  Admission Diagnoses:Vaginal bleeding, Marginal abruption   Discharge Diagnoses: same Active Problems:   Vaginal bleeding in pregnancy   Discharged Condition: stable  Hospital Course: Pt readmitted with bright red vaginal bleeding after being discharged with the same problem.  She received BMZ, and had close monitoring throughout her stay.  Now at [redacted] weeks EGA, she has not had any bleeding for the last several days and before that was only brown spotting.  [place] today is consistent with last week, with small 1.5-2 cm clot above cervix and no active bleeding.  MFM and patient all are comfortable with outpatient management of this at this time.  Consults: maternal fetal medicine  Significant Diagnostic Studies: Ultrasounds  Treatments: IVF, BMZ  Discharge Exam: Blood pressure 117/64, pulse 91, temperature 97.8 F (36.6 C), temperature source Oral, resp. rate 18, height 5\' 6"  (1.676 m), weight 86.365 kg (190 lb 6.4 oz), last menstrual period 08/04/2012, SpO2 100.00%. General appearance: alert, cooperative, appears stated age and no distress GI: soft, non-tender; bowel sounds normal; no masses,  no organomegaly  Disposition: 01-Home or Self Care  Discharge Orders   Future Orders Complete By Expires   Diet general  As directed    Discharge instructions  As directed    Comments:     Continue with modified bedrest and pelvic rest Can or return for increased bleeding Return to office in 1 week for evaluation   Increase activity slowly  As directed        Medication List         PRENATAL+DHA PO  Take 1 tablet by mouth daily.         Signed: Aronda Burford C 02/16/2013, 12:20 PM

## 2013-02-16 NOTE — Progress Notes (Signed)
Pt. Is stable and ready to be discharged home. All discharge instructions reviewed. All questions answered. Pt. Has all belongings with her. She is wheeled out via wheelchair to husbands car.

## 2013-02-16 NOTE — Progress Notes (Signed)
Patient ID: Jennifer [name], female   DOB: [birth year], 32 y.o.   MRN: [id number] Pt had a great day yesterday No spotting for last several days and had increased her activity Desires D/c home  VSSAF FHR 140s cat 1 Ctx none  No vag bleeding noted  Pt desires repeat US before home  Vaginal bleeding/ marginal abruption Stable S/P BMZ [place] today and if stable then d/c home on  Modified BR FU next week with [place] for EFW (last EFW 3 weeks ago)  DL

## 2013-04-10 LAB — OB RESULTS CONSOLE GBS: GBS: NEGATIVE

## 2013-04-28 ENCOUNTER — Encounter (HOSPITAL_COMMUNITY): Payer: Self-pay | Admitting: *Deleted

## 2013-04-28 ENCOUNTER — Telehealth (HOSPITAL_COMMUNITY): Payer: Self-pay | Admitting: *Deleted

## 2013-04-28 NOTE — Telephone Encounter (Signed)
Preadmission screen  

## 2013-05-02 ENCOUNTER — Encounter (HOSPITAL_COMMUNITY): Payer: Self-pay | Admitting: *Deleted

## 2013-05-02 ENCOUNTER — Telehealth (HOSPITAL_COMMUNITY): Payer: Self-pay | Admitting: *Deleted

## 2013-05-02 NOTE — Telephone Encounter (Signed)
Preadmission screen  

## 2013-05-04 ENCOUNTER — Inpatient Hospital Stay (HOSPITAL_COMMUNITY)
Admission: RE | Admit: 2013-05-04 | Discharge: 2013-05-10 | DRG: 766 | Disposition: A | Discharge status: Home / Self Care | Payer: PRIVATE HEALTH INSURANCE | Source: Ambulatory Visit | Attending: Obstetrics and Gynecology | Admitting: Obstetrics and Gynecology

## 2013-05-04 ENCOUNTER — Encounter (HOSPITAL_COMMUNITY): Payer: Self-pay

## 2013-05-04 DIAGNOSIS — O09819 Supervision of pregnancy resulting from assisted reproductive technology, unspecified trimester: Secondary | ICD-10-CM

## 2013-05-04 DIAGNOSIS — O324XX0 Maternal care for high head at term, not applicable or unspecified: Secondary | ICD-10-CM | POA: Diagnosis present

## 2013-05-04 DIAGNOSIS — Z87891 Personal history of nicotine dependence: Secondary | ICD-10-CM

## 2013-05-04 DIAGNOSIS — Z3490 Encounter for supervision of normal pregnancy, unspecified, unspecified trimester: Secondary | ICD-10-CM

## 2013-05-04 DIAGNOSIS — O4590 Premature separation of placenta, unspecified, unspecified trimester: Principal | ICD-10-CM | POA: Diagnosis present

## 2013-05-04 LAB — CBC
HCT: 34.5 % — ABNORMAL LOW (ref 36.0–46.0)
HEMOGLOBIN: 11.5 g/dL — AB (ref 12.0–15.0)
MCH: 29.1 pg (ref 26.0–34.0)
MCHC: 33.3 g/dL (ref 30.0–36.0)
MCV: 87.3 fL (ref 78.0–100.0)
Platelets: 222 10*3/uL (ref 150–400)
RBC: 3.95 MIL/uL (ref 3.87–5.11)
RDW: 13.1 % (ref 11.5–15.5)
WBC: 9.3 10*3/uL (ref 4.0–10.5)

## 2013-05-04 MED ORDER — FLEET ENEMA 7-19 GM/118ML RE ENEM: 1.0000 | ENEMA | RECTAL | Status: DC | PRN

## 2013-05-04 MED ORDER — CITRIC ACID-SODIUM CITRATE 334-500 MG/5ML PO SOLN
30.0000 mL | ORAL | Status: DC | PRN
  Administered 2013-05-07: 30 mL via ORAL
  Filled 2013-05-04: qty 15

## 2013-05-04 MED ORDER — IBUPROFEN 600 MG PO TABS: 600.0000 mg | ORAL_TABLET | Freq: Four times a day (QID) | ORAL | Status: DC | PRN

## 2013-05-04 MED ORDER — TERBUTALINE SULFATE 1 MG/ML IJ SOLN: 0.2500 mg | Freq: Once | INTRAMUSCULAR | Status: AC | PRN

## 2013-05-04 MED ORDER — ZOLPIDEM TARTRATE 5 MG PO TABS
5.0000 mg | ORAL_TABLET | Freq: Every evening | ORAL | Status: DC | PRN
  Administered 2013-05-04 – 2013-05-05 (×2): 5 mg via ORAL
  Filled 2013-05-04 (×2): qty 1

## 2013-05-04 MED ORDER — LACTATED RINGERS IV SOLN: 500.0000 mL | INTRAVENOUS | Status: DC | PRN

## 2013-05-04 MED ORDER — LACTATED RINGERS IV SOLN
INTRAVENOUS | Status: DC
  Administered 2013-05-04 – 2013-05-07 (×5): via INTRAVENOUS

## 2013-05-04 MED ORDER — MISOPROSTOL 25 MCG QUARTER TABLET
25.0000 ug | ORAL_TABLET | ORAL | Status: DC | PRN
  Administered 2013-05-04 – 2013-05-06 (×4): 25 ug via VAGINAL
  Filled 2013-05-04 (×4): qty 0.25

## 2013-05-04 MED ORDER — ONDANSETRON HCL 4 MG/2ML IJ SOLN
4.0000 mg | Freq: Four times a day (QID) | INTRAMUSCULAR | Status: DC | PRN
  Administered 2013-05-07: 4 mg via INTRAVENOUS
  Filled 2013-05-04: qty 2

## 2013-05-04 MED ORDER — OXYCODONE-ACETAMINOPHEN 5-325 MG PO TABS: 1.0000 | ORAL_TABLET | ORAL | Status: DC | PRN

## 2013-05-04 MED ORDER — OXYTOCIN BOLUS FROM INFUSION: 500.0000 mL | INTRAVENOUS | Status: DC

## 2013-05-04 MED ORDER — LIDOCAINE HCL (PF) 1 % IJ SOLN
30.0000 mL | INTRAMUSCULAR | Status: DC | PRN
  Filled 2013-05-04: qty 30

## 2013-05-04 MED ORDER — ACETAMINOPHEN 325 MG PO TABS
650.0000 mg | ORAL_TABLET | ORAL | Status: DC | PRN
  Administered 2013-05-07: 650 mg via ORAL
  Filled 2013-05-04: qty 2

## 2013-05-04 MED ORDER — OXYTOCIN 40 UNITS IN LACTATED RINGERS INFUSION - SIMPLE MED: 62.5000 mL/h | INTRAVENOUS | Status: DC

## 2013-05-05 LAB — RPR: RPR: NONREACTIVE

## 2013-05-05 MED ORDER — OXYTOCIN 40 UNITS IN LACTATED RINGERS INFUSION - SIMPLE MED
1.0000 m[IU]/min | INTRAVENOUS | Status: DC
  Administered 2013-05-05: 2 m[IU]/min via INTRAVENOUS
  Filled 2013-05-05: qty 1000

## 2013-05-05 MED ORDER — TERBUTALINE SULFATE 1 MG/ML IJ SOLN: 0.2500 mg | Freq: Once | INTRAMUSCULAR | Status: AC | PRN

## 2013-05-05 MED ORDER — OXYTOCIN 40 UNITS IN LACTATED RINGERS INFUSION - SIMPLE MED
1.0000 m[IU]/min | INTRAVENOUS | Status: DC
  Administered 2013-05-06: 2 m[IU]/min via INTRAVENOUS
  Filled 2013-05-05: qty 1000

## 2013-05-05 NOTE — Progress Notes (Signed)
Patient ID: Jennifer [name], female   DOB: [birth year], 33 y.o.   MRN: [id number] Pt mildly uncomfortable with ctxs Still wants to proceed with induction of labor VSSAF FHR 140s + accels Cat 1 Ctxs q 5 min and mild  Cx 1.5/50/-3  I offered the patient to dc pitocin, cytotec, then arom in am Versus continue with pitocin, versus C/S Pt elects to go with the first plan Fetus is looking good on EFM and no signs of bleeding DL

## 2013-05-05 NOTE — H&P (Signed)
Jennifer [name] is a 33 y.o. female presenting for IOL due to marginal abruption.  Was diagnosed with abruption at end of 2nd trimester with heavy vaginal bleeding and 2 hospital admissions, the second for about a month.  Has had serial US and twice weekly NSTs since and US has showed good growth, but persistence of 3 cm clot at leading edge of placenta.  Pt has had only mild spotting in last month and now presents for induction of labor.  Wants to attempt labor but knows that [name]/S may be needed if baby doesn't tolerate labor or if bleeding becomes excessive.  She again wants TOL.  GBS -.  This is IVF pregnancy. History OB History   Grav Para Term Preterm Abortions TAB SAB Ect Mult Living   3    2 1 1         Past Medical History  Diagnosis Date  . Vaginal Pap smear, abnormal   . History of kidney stones   . Newborn product of IVF pregnancy    Past Surgical History  Procedure Laterality Date  . Colposcopy    . Dilation and evacuation     Family History: family history includes Cancer in her maternal grandmother, mother, and paternal grandmother; Heart attack in her paternal grandfather and paternal grandmother; Stroke in her paternal grandfather. Social History:  reports that she quit smoking about 5 years ago. She has never used smokeless tobacco. She reports that she does not drink alcohol or use illicit drugs.   Prenatal Transfer Tool  Maternal Diabetes: No Genetic Screening: Normal Maternal Ultrasounds/Referrals: Abnormal:  Findings:   Other:Marginal abruption, has had BMZ series Fetal Ultrasounds or other Referrals:  Referred to Materal Fetal Medicine  Maternal Substance Abuse:  No Significant Maternal Medications:  None Significant Maternal Lab Results:  None Other Comments:  None  ROS  Dilation: 2 Effacement (%): 40 Station: -3 Exam by:: L.Stubbs, RN Blood pressure 124/75, pulse 87, temperature 98.4 F (36.9 [name]), temperature source Oral, resp. rate 16, height 5\' 6"  (1.676  m), weight 101.606 kg (224 lb), last menstrual period 08/04/2012. Exam Physical Exam  Prenatal labs: ABO, Rh: --/--/A POS (12/17 0810) Antibody: NEG (12/17 0810) Rubella: Immune (08/14 0000) RPR: NON REACTIVE (03/05 2015)  HBsAg: Negative (08/14 0000)  HIV: Non-reactive (08/14 0000)  GBS: Negative (02/09 0000)   Assessment/Plan: IUP at 39 weeks Stable Marginal Abruption desires TOL,IOL S/P cytotec with some response.  Int os is not dilated so will begin pitocin.  FHR Cat 1   Jennifer [name] 05/05/2013, 8:30 AM

## 2013-05-06 ENCOUNTER — Inpatient Hospital Stay (HOSPITAL_COMMUNITY): Payer: PRIVATE HEALTH INSURANCE | Admitting: Anesthesiology

## 2013-05-06 ENCOUNTER — Encounter (HOSPITAL_COMMUNITY): Payer: PRIVATE HEALTH INSURANCE | Admitting: Anesthesiology

## 2013-05-06 LAB — TYPE AND SCREEN
ABO/RH(D): A POS
ANTIBODY SCREEN: NEGATIVE

## 2013-05-06 MED ORDER — PHENYLEPHRINE 40 MCG/ML (10ML) SYRINGE FOR IV PUSH (FOR BLOOD PRESSURE SUPPORT): 80.0000 ug | PREFILLED_SYRINGE | INTRAVENOUS | Status: DC | PRN

## 2013-05-06 MED ORDER — FENTANYL 2.5 MCG/ML BUPIVACAINE 1/10 % EPIDURAL INFUSION (WH - ANES)
14.0000 mL/h | INTRAMUSCULAR | Status: DC | PRN
  Administered 2013-05-06: 14 mL/h via EPIDURAL

## 2013-05-06 MED ORDER — FENTANYL 2.5 MCG/ML BUPIVACAINE 1/10 % EPIDURAL INFUSION (WH - ANES)
INTRAMUSCULAR | Status: DC | PRN
  Administered 2013-05-06: 14 mL/h via EPIDURAL

## 2013-05-06 MED ORDER — PHENYLEPHRINE 40 MCG/ML (10ML) SYRINGE FOR IV PUSH (FOR BLOOD PRESSURE SUPPORT)
80.0000 ug | PREFILLED_SYRINGE | INTRAVENOUS | Status: DC | PRN
  Filled 2013-05-06: qty 10

## 2013-05-06 MED ORDER — LACTATED RINGERS IV SOLN: 500.0000 mL | Freq: Once | INTRAVENOUS | Status: DC

## 2013-05-06 MED ORDER — EPHEDRINE 5 MG/ML INJ
10.0000 mg | INTRAVENOUS | Status: DC | PRN
  Filled 2013-05-06: qty 4

## 2013-05-06 MED ORDER — DIPHENHYDRAMINE HCL 50 MG/ML IJ SOLN: 12.5000 mg | INTRAMUSCULAR | Status: DC | PRN

## 2013-05-06 MED ORDER — LIDOCAINE HCL (PF) 1 % IJ SOLN
INTRAMUSCULAR | Status: DC | PRN
  Administered 2013-05-06: 4 mL

## 2013-05-06 MED ORDER — FENTANYL 2.5 MCG/ML BUPIVACAINE 1/10 % EPIDURAL INFUSION (WH - ANES)
14.0000 mL/h | INTRAMUSCULAR | Status: DC | PRN
  Administered 2013-05-06: 14 mL/h via EPIDURAL
  Filled 2013-05-06 (×3): qty 125

## 2013-05-06 MED ORDER — EPHEDRINE 5 MG/ML INJ: 10.0000 mg | INTRAVENOUS | Status: DC | PRN

## 2013-05-06 NOTE — Progress Notes (Signed)
Patient ID: Jennifer [name], female   DOB: 05-01-1980, 33 y.o.   MRN: [id number] Pt without complaints VSSAF FHR 140s + accels Ctxs 1 5-7 min mild  Cx 1.5/30/-3 AROM clear  IUP at 39 + weeks AROM and pitocin  Anticipate SVD

## 2013-05-06 NOTE — Anesthesia Preprocedure Evaluation (Addendum)
Anesthesia Evaluation  Patient identified by MRN, date of birth, ID band Patient awake    Reviewed: Allergy & Precautions, H&P , NPO status , Patient's Chart, lab work & pertinent test results  Airway Mallampati: II TM Distance: >3 FB     Dental   Pulmonary former smoker,          Cardiovascular negative cardio ROS  Rhythm:Regular     Neuro/Psych negative neurological ROS  negative psych ROS   GI/Hepatic negative GI ROS, Neg liver ROS,   Endo/Other  negative endocrine ROS  Renal/GU negative Renal ROS     Musculoskeletal negative musculoskeletal ROS (+)   Abdominal (+) + obese,   Peds  Hematology negative hematology ROS (+)   Anesthesia Other Findings   Reproductive/Obstetrics (+) Pregnancy                          Anesthesia Physical Anesthesia Plan  ASA: II  Anesthesia Plan: Epidural   Post-op Pain Management:    Induction:   Airway Management Planned:   Additional Equipment:   Intra-op Plan:   Post-operative Plan:   Informed Consent: I have reviewed the patients History and Physical, chart, labs and discussed the procedure including the risks, benefits and alternatives for the proposed anesthesia with the patient or authorized representative who has indicated his/her understanding and acceptance.     Plan Discussed with:   Anesthesia Plan Comments:         Anesthesia Quick Evaluation

## 2013-05-06 NOTE — Anesthesia Procedure Notes (Signed)
Epidural Patient location during procedure: OB Start time: 05/06/2013 10:05 AM End time: 05/06/2013 10:20 AM  Staffing Anesthesiologist: Lewie LoronGERMEROTH, Laasia Arcos R Performed by: anesthesiologist   Preanesthetic Checklist Completed: patient identified, pre-op evaluation, timeout performed, IV checked, risks and benefits discussed and monitors and equipment checked  Epidural Patient position: sitting Prep: site prepped and draped and DuraPrep Patient monitoring: heart rate Approach: midline Location: L2-L3 Injection technique: LOR air and LOR saline  Needle:  Needle type: Tuohy  Needle gauge: 17 G Needle length: 9 cm Needle insertion depth: 9 cm Catheter type: closed end flexible Catheter size: 19 Gauge Catheter at skin depth: 15 cm Test dose: negative  Assessment Sensory level: T8 Events: blood not aspirated, injection not painful, no injection resistance, negative IV test and no paresthesia  Additional Notes Reason for block:procedure for pain

## 2013-05-07 ENCOUNTER — Encounter (HOSPITAL_COMMUNITY): Payer: Self-pay

## 2013-05-07 ENCOUNTER — Encounter (HOSPITAL_COMMUNITY)
Admission: RE | Disposition: A | Discharge status: Home / Self Care | Payer: Self-pay | Source: Ambulatory Visit | Attending: Obstetrics and Gynecology

## 2013-05-07 SURGERY — Surgical Case
Anesthesia: Epidural

## 2013-05-07 SURGERY — Surgical Case
Anesthesia: Regional

## 2013-05-07 MED ORDER — NALBUPHINE HCL 10 MG/ML IJ SOLN: 5.0000 mg | INTRAMUSCULAR | Status: DC | PRN

## 2013-05-07 MED ORDER — LACTATED RINGERS IV SOLN
INTRAVENOUS | Status: DC
  Administered 2013-05-07: 13:00:00 via INTRAVENOUS

## 2013-05-07 MED ORDER — PRENATAL MULTIVITAMIN CH
1.0000 | ORAL_TABLET | Freq: Every day | ORAL | Status: DC
  Administered 2013-05-08 – 2013-05-10 (×3): 1 via ORAL
  Filled 2013-05-07 (×3): qty 1

## 2013-05-07 MED ORDER — NALOXONE HCL 0.4 MG/ML IJ SOLN: 0.4000 mg | INTRAMUSCULAR | Status: DC | PRN

## 2013-05-07 MED ORDER — OXYTOCIN 40 UNITS IN LACTATED RINGERS INFUSION - SIMPLE MED: 62.5000 mL/h | INTRAVENOUS | Status: AC

## 2013-05-07 MED ORDER — LIDOCAINE-EPINEPHRINE (PF) 2 %-1:200000 IJ SOLN
INTRAMUSCULAR | Status: DC | PRN
  Administered 2013-05-07 (×2): 10 mL via EPIDURAL

## 2013-05-07 MED ORDER — MORPHINE SULFATE (PF) 0.5 MG/ML IJ SOLN
INTRAMUSCULAR | Status: DC | PRN
  Administered 2013-05-07: 4 mg via EPIDURAL

## 2013-05-07 MED ORDER — SENNOSIDES-DOCUSATE SODIUM 8.6-50 MG PO TABS
2.0000 | ORAL_TABLET | ORAL | Status: DC
  Administered 2013-05-07 – 2013-05-10 (×3): 2 via ORAL
  Filled 2013-05-07 (×3): qty 2

## 2013-05-07 MED ORDER — MENTHOL 3 MG MT LOZG: 1.0000 | LOZENGE | OROMUCOSAL | Status: DC | PRN

## 2013-05-07 MED ORDER — KETOROLAC TROMETHAMINE 60 MG/2ML IM SOLN
60.0000 mg | Freq: Once | INTRAMUSCULAR | Status: AC | PRN
  Administered 2013-05-07: 60 mg via INTRAMUSCULAR

## 2013-05-07 MED ORDER — METOCLOPRAMIDE HCL 5 MG/ML IJ SOLN: 10.0000 mg | Freq: Three times a day (TID) | INTRAMUSCULAR | Status: DC | PRN

## 2013-05-07 MED ORDER — SCOPOLAMINE 1 MG/3DAYS TD PT72
MEDICATED_PATCH | TRANSDERMAL | Status: AC
  Administered 2013-05-07: 1.5 mg via TRANSDERMAL
  Filled 2013-05-07: qty 1

## 2013-05-07 MED ORDER — SCOPOLAMINE 1 MG/3DAYS TD PT72
1.0000 | MEDICATED_PATCH | Freq: Once | TRANSDERMAL | Status: AC
  Administered 2013-05-07: 1.5 mg via TRANSDERMAL

## 2013-05-07 MED ORDER — SODIUM BICARBONATE 8.4 % IV SOLN
INTRAVENOUS | Status: AC
  Filled 2013-05-07: qty 50

## 2013-05-07 MED ORDER — SIMETHICONE 80 MG PO CHEW
80.0000 mg | CHEWABLE_TABLET | Freq: Three times a day (TID) | ORAL | Status: DC
  Administered 2013-05-07 – 2013-05-10 (×8): 80 mg via ORAL
  Filled 2013-05-07 (×8): qty 1

## 2013-05-07 MED ORDER — SODIUM CHLORIDE 0.9 % IJ SOLN: 3.0000 mL | INTRAMUSCULAR | Status: DC | PRN

## 2013-05-07 MED ORDER — ZOLPIDEM TARTRATE 5 MG PO TABS: 5.0000 mg | ORAL_TABLET | Freq: Every evening | ORAL | Status: DC | PRN

## 2013-05-07 MED ORDER — LACTATED RINGERS IV SOLN
INTRAVENOUS | Status: DC | PRN
  Administered 2013-05-07: 06:00:00 via INTRAVENOUS

## 2013-05-07 MED ORDER — ONDANSETRON HCL 4 MG/2ML IJ SOLN: 4.0000 mg | Freq: Three times a day (TID) | INTRAMUSCULAR | Status: DC | PRN

## 2013-05-07 MED ORDER — DIPHENHYDRAMINE HCL 25 MG PO CAPS: 25.0000 mg | ORAL_CAPSULE | Freq: Four times a day (QID) | ORAL | Status: DC | PRN

## 2013-05-07 MED ORDER — CHLOROPROCAINE HCL 3 % IJ SOLN
INTRAMUSCULAR | Status: AC
  Filled 2013-05-07: qty 20

## 2013-05-07 MED ORDER — MEPERIDINE HCL 25 MG/ML IJ SOLN: 6.2500 mg | INTRAMUSCULAR | Status: DC | PRN

## 2013-05-07 MED ORDER — CHLOROPROCAINE HCL 3 % IJ SOLN
INTRAMUSCULAR | Status: DC | PRN
  Administered 2013-05-07: 20 mL via EPIDURAL

## 2013-05-07 MED ORDER — LIDOCAINE-EPINEPHRINE (PF) 2 %-1:200000 IJ SOLN
INTRAMUSCULAR | Status: AC
  Filled 2013-05-07: qty 20

## 2013-05-07 MED ORDER — EPHEDRINE SULFATE 50 MG/ML IJ SOLN
INTRAMUSCULAR | Status: DC | PRN
  Administered 2013-05-07: 10 mg via INTRAVENOUS

## 2013-05-07 MED ORDER — PROMETHAZINE HCL 25 MG/ML IJ SOLN: 6.2500 mg | INTRAMUSCULAR | Status: DC | PRN

## 2013-05-07 MED ORDER — DEXTROSE 5 % IV SOLN
2.0000 g | Freq: Once | INTRAVENOUS | Status: AC
  Administered 2013-05-07: 2 g via INTRAVENOUS
  Filled 2013-05-07: qty 2

## 2013-05-07 MED ORDER — LACTATED RINGERS IV SOLN
INTRAVENOUS | Status: DC | PRN
  Administered 2013-05-07 (×3): via INTRAVENOUS

## 2013-05-07 MED ORDER — CHLOROPROCAINE HCL 3 % IJ SOLN
INTRAMUSCULAR | Status: DC | PRN
  Administered 2013-05-07: 20 mL

## 2013-05-07 MED ORDER — SIMETHICONE 80 MG PO CHEW: 80.0000 mg | CHEWABLE_TABLET | ORAL | Status: DC | PRN

## 2013-05-07 MED ORDER — EPHEDRINE 5 MG/ML INJ
INTRAVENOUS | Status: AC
  Filled 2013-05-07: qty 10

## 2013-05-07 MED ORDER — DIPHENHYDRAMINE HCL 50 MG/ML IJ SOLN: 25.0000 mg | INTRAMUSCULAR | Status: DC | PRN

## 2013-05-07 MED ORDER — ONDANSETRON HCL 4 MG/2ML IJ SOLN: 4.0000 mg | INTRAMUSCULAR | Status: DC | PRN

## 2013-05-07 MED ORDER — LANOLIN HYDROUS EX OINT: 1.0000 "application " | TOPICAL_OINTMENT | CUTANEOUS | Status: DC | PRN

## 2013-05-07 MED ORDER — TETANUS-DIPHTH-ACELL PERTUSSIS 5-2.5-18.5 LF-MCG/0.5 IM SUSP: 0.5000 mL | Freq: Once | INTRAMUSCULAR | Status: DC

## 2013-05-07 MED ORDER — SIMETHICONE 80 MG PO CHEW
80.0000 mg | CHEWABLE_TABLET | ORAL | Status: DC
  Administered 2013-05-07 – 2013-05-10 (×3): 80 mg via ORAL
  Filled 2013-05-07 (×3): qty 1

## 2013-05-07 MED ORDER — LACTATED RINGERS IV SOLN
40.0000 [IU] | INTRAVENOUS | Status: DC | PRN
  Administered 2013-05-07: 40 [IU] via INTRAVENOUS

## 2013-05-07 MED ORDER — ONDANSETRON HCL 4 MG/2ML IJ SOLN
INTRAMUSCULAR | Status: DC | PRN
  Administered 2013-05-07: 4 mg via INTRAVENOUS

## 2013-05-07 MED ORDER — ONDANSETRON HCL 4 MG PO TABS: 4.0000 mg | ORAL_TABLET | ORAL | Status: DC | PRN

## 2013-05-07 MED ORDER — HYDROMORPHONE HCL PF 1 MG/ML IJ SOLN: 0.2500 mg | INTRAMUSCULAR | Status: DC | PRN

## 2013-05-07 MED ORDER — DIPHENHYDRAMINE HCL 50 MG/ML IJ SOLN: 12.5000 mg | INTRAMUSCULAR | Status: DC | PRN

## 2013-05-07 MED ORDER — DIPHENHYDRAMINE HCL 25 MG PO CAPS: 25.0000 mg | ORAL_CAPSULE | ORAL | Status: DC | PRN

## 2013-05-07 MED ORDER — IBUPROFEN 600 MG PO TABS
600.0000 mg | ORAL_TABLET | Freq: Four times a day (QID) | ORAL | Status: DC
  Administered 2013-05-07 – 2013-05-10 (×11): 600 mg via ORAL
  Filled 2013-05-07 (×11): qty 1

## 2013-05-07 MED ORDER — NALOXONE HCL 1 MG/ML IJ SOLN
1.0000 ug/kg/h | INTRAVENOUS | Status: DC | PRN
  Filled 2013-05-07: qty 2

## 2013-05-07 MED ORDER — MORPHINE SULFATE (PF) 0.5 MG/ML IJ SOLN
INTRAMUSCULAR | Status: DC | PRN
  Administered 2013-05-07: 1 mg via INTRAVENOUS

## 2013-05-07 MED ORDER — MORPHINE SULFATE 0.5 MG/ML IJ SOLN
INTRAMUSCULAR | Status: AC
  Filled 2013-05-07: qty 10

## 2013-05-07 MED ORDER — OXYTOCIN 10 UNIT/ML IJ SOLN
INTRAMUSCULAR | Status: AC
  Filled 2013-05-07: qty 4

## 2013-05-07 MED ORDER — OXYCODONE-ACETAMINOPHEN 5-325 MG PO TABS
1.0000 | ORAL_TABLET | ORAL | Status: DC | PRN
  Administered 2013-05-07 – 2013-05-08 (×2): 1 via ORAL
  Administered 2013-05-08 – 2013-05-09 (×4): 2 via ORAL
  Administered 2013-05-10 (×3): 1 via ORAL
  Filled 2013-05-07: qty 2
  Filled 2013-05-07 (×2): qty 1
  Filled 2013-05-07 (×3): qty 2
  Filled 2013-05-07 (×3): qty 1

## 2013-05-07 MED ORDER — WITCH HAZEL-GLYCERIN EX PADS: 1.0000 "application " | MEDICATED_PAD | CUTANEOUS | Status: DC | PRN

## 2013-05-07 MED ORDER — ONDANSETRON HCL 4 MG/2ML IJ SOLN
INTRAMUSCULAR | Status: AC
  Filled 2013-05-07: qty 2

## 2013-05-07 MED ORDER — KETOROLAC TROMETHAMINE 30 MG/ML IJ SOLN
30.0000 mg | Freq: Four times a day (QID) | INTRAMUSCULAR | Status: AC | PRN
  Administered 2013-05-07: 30 mg via INTRAVENOUS
  Filled 2013-05-07: qty 1

## 2013-05-07 MED ORDER — DIBUCAINE 1 % RE OINT: 1.0000 "application " | TOPICAL_OINTMENT | RECTAL | Status: DC | PRN

## 2013-05-07 MED ORDER — KETOROLAC TROMETHAMINE 30 MG/ML IJ SOLN: 30.0000 mg | Freq: Four times a day (QID) | INTRAMUSCULAR | Status: AC | PRN

## 2013-05-07 MED ORDER — KETOROLAC TROMETHAMINE 60 MG/2ML IM SOLN
INTRAMUSCULAR | Status: AC
  Administered 2013-05-07: 60 mg via INTRAMUSCULAR
  Filled 2013-05-07: qty 2

## 2013-05-07 SURGICAL SUPPLY — 31 items
CLAMP CORD UMBIL (MISCELLANEOUS) IMPLANT
CLOTH BEACON ORANGE TIMEOUT ST (SAFETY) IMPLANT
DERMABOND ADHESIVE PROPEN (GAUZE/BANDAGES/DRESSINGS)
DERMABOND ADVANCED .7 DNX6 (GAUZE/BANDAGES/DRESSINGS) IMPLANT
DRAPE LG THREE QUARTER DISP (DRAPES) IMPLANT
DRSG OPSITE POSTOP 4X10 (GAUZE/BANDAGES/DRESSINGS) IMPLANT
DURAPREP 26ML APPLICATOR (WOUND CARE) IMPLANT
ELECT REM PT RETURN 9FT ADLT (ELECTROSURGICAL)
ELECTRODE REM PT RTRN 9FT ADLT (ELECTROSURGICAL) IMPLANT
EXTRACTOR VACUUM M CUP 4 TUBE (SUCTIONS) IMPLANT
EXTRACTOR VACUUM M CUP 4' TUBE (SUCTIONS)
GLOVE BIO SURGEON STRL SZ7 (GLOVE) IMPLANT
GOWN STRL REUS W/ TWL XL LVL3 (GOWN DISPOSABLE) IMPLANT
GOWN STRL REUS W/TWL LRG LVL3 (GOWN DISPOSABLE) IMPLANT
GOWN STRL REUS W/TWL XL LVL3 (GOWN DISPOSABLE)
KIT ABG SYR 3ML LUER SLIP (SYRINGE) IMPLANT
NEEDLE HYPO 25X5/8 SAFETYGLIDE (NEEDLE) IMPLANT
NS IRRIG 1000ML POUR BTL (IV SOLUTION) IMPLANT
PACK C SECTION WH (CUSTOM PROCEDURE TRAY) IMPLANT
PAD OB MATERNITY 4.3X12.25 (PERSONAL CARE ITEMS) IMPLANT
STAPLER VISISTAT 35W (STAPLE) IMPLANT
SUT CHROMIC 0 CTX 36 (SUTURE) IMPLANT
SUT MON AB 4-0 PS1 27 (SUTURE) IMPLANT
SUT PDS AB 0 CT 36 (SUTURE) IMPLANT
SUT PLAIN 0 NONE (SUTURE) IMPLANT
SUT PLAIN 2 0 XLH (SUTURE) IMPLANT
SUT VIC AB 3-0 CT1 27 (SUTURE)
SUT VIC AB 3-0 CT1 TAPERPNT 27 (SUTURE) IMPLANT
TOWEL OR 17X24 6PK STRL BLUE (TOWEL DISPOSABLE) IMPLANT
TRAY FOLEY CATH 14FR (SET/KITS/TRAYS/PACK) IMPLANT
WATER STERILE IRR 1000ML POUR (IV SOLUTION) IMPLANT

## 2013-05-07 SURGICAL SUPPLY — 30 items
CLAMP CORD UMBIL (MISCELLANEOUS) IMPLANT
CLOTH BEACON ORANGE TIMEOUT ST (SAFETY) ×3 IMPLANT
DRAPE LG THREE QUARTER DISP (DRAPES) IMPLANT
DRSG OPSITE POSTOP 4X10 (GAUZE/BANDAGES/DRESSINGS) ×3 IMPLANT
DURAPREP 26ML APPLICATOR (WOUND CARE) ×3 IMPLANT
ELECT REM PT RETURN 9FT ADLT (ELECTROSURGICAL) ×3
ELECTRODE REM PT RTRN 9FT ADLT (ELECTROSURGICAL) ×1 IMPLANT
EXTRACTOR VACUUM M CUP 4 TUBE (SUCTIONS) IMPLANT
EXTRACTOR VACUUM M CUP 4' TUBE (SUCTIONS)
GLOVE SURG ORTHO 8.0 STRL STRW (GLOVE) ×3 IMPLANT
GOWN STRL REUS W/ TWL XL LVL3 (GOWN DISPOSABLE) ×1 IMPLANT
GOWN STRL REUS W/TWL LRG LVL3 (GOWN DISPOSABLE) ×3 IMPLANT
GOWN STRL REUS W/TWL XL LVL3 (GOWN DISPOSABLE) ×2
KIT ABG SYR 3ML LUER SLIP (SYRINGE) ×3 IMPLANT
NEEDLE HYPO 25X5/8 SAFETYGLIDE (NEEDLE) ×3 IMPLANT
NS IRRIG 1000ML POUR BTL (IV SOLUTION) ×3 IMPLANT
PACK C SECTION WH (CUSTOM PROCEDURE TRAY) ×3 IMPLANT
PAD OB MATERNITY 4.3X12.25 (PERSONAL CARE ITEMS) ×3 IMPLANT
SPONGE LAP 18X18 X RAY DECT (DISPOSABLE) ×6 IMPLANT
STAPLER VISISTAT 35W (STAPLE) IMPLANT
SUT MNCRL 0 VIOLET CTX 36 (SUTURE) ×3 IMPLANT
SUT MONOCRYL 0 CTX 36 (SUTURE) ×6
SUT PDS AB 1 CT  36 (SUTURE)
SUT PDS AB 1 CT 36 (SUTURE) IMPLANT
SUT PLAIN 2 0 XLH (SUTURE) ×3 IMPLANT
SUT VIC AB 1 CTX 36 (SUTURE)
SUT VIC AB 1 CTX36XBRD ANBCTRL (SUTURE) IMPLANT
TOWEL OR 17X24 6PK STRL BLUE (TOWEL DISPOSABLE) ×3 IMPLANT
TRAY FOLEY CATH 14FR (SET/KITS/TRAYS/PACK) ×3 IMPLANT
WATER STERILE IRR 1000ML POUR (IV SOLUTION) ×3 IMPLANT

## 2013-05-07 NOTE — Anesthesia Postprocedure Evaluation (Signed)
  Anesthesia Post Note  Patient: Jennifer [name]  Procedure(s) Performed: Procedure(s) (LRB): PRIMARY CESAREAN SECTION (N/A)  Anesthesia type: Epidural  Patient location: PACU  Post pain: Pain level controlled  Post assessment: Post-op Vital signs reviewed  Last Vitals:  Filed Vitals:   05/07/13 0730  BP: 112/64  Pulse: 103  Temp: 36.9 C  Resp: 17    Post vital signs: Reviewed  Level of consciousness: awake  Complications: No apparent anesthesia complications

## 2013-05-07 NOTE — Anesthesia Postprocedure Evaluation (Signed)
Anesthesia Post Note  Patient: Jennifer [name]  Procedure(s) Performed: Procedure(s) (LRB): PRIMARY CESAREAN SECTION (N/A)  Anesthesia type: Epidural  Patient location: Mother/Baby  Post pain: Pain level controlled  Post assessment: Post-op Vital signs reviewed  Last Vitals:  Filed Vitals:   05/07/13 0803  BP: 113/71  Pulse: 99  Temp: 36.8 C  Resp: 16    Post vital signs: Reviewed  Level of consciousness: awake  Complications: No apparent anesthesia complications

## 2013-05-07 NOTE — Addendum Note (Signed)
Addendum created 05/07/13 1551 by Renford DillsJanet L Peityn Payton, CRNA   Modules edited: Notes Section   Notes Section:  File: [id number]

## 2013-05-07 NOTE — Addendum Note (Signed)
Addendum created 05/07/13 0948 by Jhonnie GarnerBeth M Armilda Vanderlinden, CRNA   Modules edited: Notes Section   Notes Section:  File: [id number]

## 2013-05-07 NOTE — Progress Notes (Addendum)
Patient ID: Jennifer [name], female   DOB: [birth year], 33 y.o.   MRN: [id number] Pt had been pushing for 1.5 hours with vtx now at +2 /ROP and pt now refuses to push any further due to discomfort with pushing.  Pelvis appears to be adequate based on exam but she states the discomfort with pushing is too much.  I offered increasing relief with epidural and possible vacuum, but patient requests primary C/S FHR 140s + accels Cat 1 Ctx q 3 minutes Arrest of descent Risks and benefits of C/S were discussed.  All questions were answered and informed consent was obtained.  Plan to proceed with low segment transverse Cesarean Section.

## 2013-05-07 NOTE — Transfer of Care (Signed)
Immediate Anesthesia Transfer of Care Note  Patient: Jennifer [name]  Procedure(s) Performed: Procedure(s): PRIMARY CESAREAN SECTION (N/A)  Patient Location: PACU  Anesthesia Type:Epidural  Level of Consciousness: awake  Airway & Oxygen Therapy: Patient Spontanous Breathing  Post-op Assessment: Report given to PACU RN and Post -op Vital signs reviewed and stable  Post vital signs: stable  Complications: No apparent anesthesia complications

## 2013-05-07 NOTE — Op Note (Signed)
Cesarean Section Procedure Note  Pre-operative Diagnosis: IUP at 39 weeks, marginal abruption, Arrest of descent  Post-operative Diagnosis: same  Surgeon: Turner DanielsLOWE,Dannell Gortney C   Assistants: none  Anesthesia:epidural  Procedure:  Low Segment Transverse cesarean section  Procedure Details  The patient was seen in the Holding Room. The risks, benefits, complications, treatment options, and expected outcomes were discussed with the patient.  The patient concurred with the proposed plan, giving informed consent.  The site of surgery properly noted/marked.. A Time Out was held and the above information confirmed.  After induction of anesthesia, the patient was draped and prepped in the usual sterile manner. A Pfannenstiel incision was made and carried down through the subcutaneous tissue to the fascia. Fascial incision was made and extended transversely. The fascia was separated from the underlying rectus tissue superiorly and inferiorly. The peritoneum was identified and entered. Peritoneal incision was extended longitudinally. The utero-vesical peritoneal reflection was incised transversely and the bladder flap was bluntly freed from the lower uterine segment. A low transverse uterine incision was made. Delivered from ROP Vertex presentation was a baby with Apgar scores of 9 at one minute and 9 at five minutes. After the umbilical cord was clamped and cut cord blood was obtained for evaluation. The placenta was removed intact and appeared normal. The uterine outline, tubes and ovaries appeared normal. The uterine incision was closed with running locked sutures of 0 monocryl and imbricated with 0 monocryl. Hemostasis was observed. Lavage was carried out until clear. The peritoneum was then closed with 0 monocryl and rectus muscles plicated in the midline.  After hemostasis was assured, the fascia was then reapproximated with running sutures of 0 PDS. Irrigation was applied and after adequate hemostasis was  assured, campers fascia closed with 0 plain suture and the skin was reapproximated with staples.  Instrument, sponge, and needle counts were correct prior the abdominal closure and at the conclusion of the case. The patient received 2 grams cefotetan preoperatively.  Findings: female  Estimated Blood Loss:  900cc         Specimens: Placenta was sent to pathology         Complications:  None

## 2013-05-07 NOTE — Anesthesia Postprocedure Evaluation (Signed)
  Anesthesia Post-op Note  Patient: Jennifer [name]  Procedure(s) Performed: Procedure(s): PRIMARY CESAREAN SECTION (N/A)  Patient Location: Mother/Baby  Anesthesia Type:Epidural  Level of Consciousness: awake  Airway and Oxygen Therapy: Patient Spontanous Breathing  Post-op Pain: mild  Post-op Assessment: Patient's Cardiovascular Status Stable and Respiratory Function Stable  Post-op Vital Signs: stable  Complications: No apparent anesthesia complications

## 2013-05-08 ENCOUNTER — Encounter (HOSPITAL_COMMUNITY): Payer: Self-pay | Admitting: Obstetrics and Gynecology

## 2013-05-08 LAB — CBC
HCT: 26.1 % — ABNORMAL LOW (ref 36.0–46.0)
Hemoglobin: 8.8 g/dL — ABNORMAL LOW (ref 12.0–15.0)
MCH: 29.7 pg (ref 26.0–34.0)
MCHC: 33.7 g/dL (ref 30.0–36.0)
MCV: 88.2 fL (ref 78.0–100.0)
Platelets: 166 10*3/uL (ref 150–400)
RBC: 2.96 MIL/uL — ABNORMAL LOW (ref 3.87–5.11)
RDW: 13.3 % (ref 11.5–15.5)
WBC: 12.4 10*3/uL — ABNORMAL HIGH (ref 4.0–10.5)

## 2013-05-08 NOTE — Lactation Note (Signed)
This note was copied from the chart of Boy Paulina Fusinna Buscemi. Lactation Consultation Note RN request LC assistance; baby now 3126 hours old and has not had a feeding since birth. Mom attempting to feed baby now, but baby sound asleep at the breast.  Unable to wake baby for a feeding. Mom has been pumping, and has not gotten any milk out. Attempted to hand express with mom, no colostrum expressed.   Discussed with mom and dad the need to supplement baby at this time. Discussed options to supplement while protecting breastfeeding. Recommended using a nipple shield to stimulate baby's suck reflex and squirt formula into the nipple shield as baby sucks. Mom's nipples are everted, but baby just does not have enough strength to maintain his suction without the shield.   Nipple shield initiated, 12 mL formula squirted as baby sucked, baby took the full volume. Dad and MGM at bedside to learn the technique. Dad states he will be able to assist mom to feed baby with this method.   Written feeding plan provided: frequent STS and cue based feeding, at least 8 feeds per day. Attempt to breast feed. If no latch, place nipple shield and fill with pumped breast milk if available, or formula.  Pump 8 times per day and hand express after pumping.  Call for help if any concerns.  Mom and dad reassured that this is likely a temporary issue and that baby may start feeding better once he wakes up. Inst mom and dad that the feeding plan will change as the needs of the baby change.   Mom very tired after we fed baby. Inappropriate time to give too much education. Enc mom to review Baby and Me book breastfeeding basics when she has a chance. Lactation brochure provided, community resources provided, mom made aware of lactation services and BFSG. Mom enc to call if she has any concerns.   Mom and dad very pleased to get baby fed; they are still committed to breastfeeding.   Patient Name: Boy Paulina Fusinna Burciaga NWGNF'AToday's Date:  05/08/2013 Reason for consult: Initial assessment   Maternal Data Formula Feeding for Exclusion: No Has patient been taught Hand Expression?: Yes Does the patient have breastfeeding experience prior to this delivery?: No  Feeding Feeding Type: Breast Fed Length of feed: 15 min  LATCH Score/Interventions Latch: Repeated attempts needed to sustain latch, nipple held in mouth throughout feeding, stimulation needed to elicit sucking reflex.  Audible Swallowing: Spontaneous and intermittent (squirting formula into nipple shield)  Type of Nipple: Everted at rest and after stimulation  Comfort (Breast/Nipple): Soft / non-tender     Hold (Positioning): Assistance needed to correctly position infant at breast and maintain latch.  LATCH Score: 8  Lactation Tools Discussed/Used Tools: Nipple Shields (syringe) Nipple shield size: 24   Consult Status Consult Status: Follow-up Follow-up type: In-patient    Octavio MannsSanders, Kingstyn Deruiter [facility] 05/08/2013, 11:27 AM

## 2013-05-08 NOTE — Progress Notes (Signed)
Subjective: Postpartum Day 1: Cesarean Delivery Patient reports tolerating PO, + flatus and no problems voiding.    Objective: Vital signs in last 24 hours: Temp:  [97.9 F (36.6 C)-99.1 F (37.3 C)] 98.2 F (36.8 C) (03/09 0554) Pulse Rate:  [80-111] 82 (03/09 0554) Resp:  [16-18] 18 (03/09 0554) BP: (93-116)/(54-72) 95/54 mmHg (03/09 0554) SpO2:  [94 %-98 %] 97 % (03/09 0554)  Physical Exam:  General: alert and cooperative Lochia: appropriate Uterine Fundus: firm Incision: honeycomb dressing CDI DVT Evaluation: No evidence of DVT seen on physical exam. Negative Homan's sign. No cords or calf tenderness. Calf/Ankle edema is present.   Recent Labs  05/08/13 0555  HGB 8.8*  HCT 26.1*    Assessment/Plan: Status post Cesarean section. Doing well postoperatively.  Continue current care.  CURTIS,CAROL G 05/08/2013, 8:25 AM

## 2013-05-09 MED ORDER — FERROUS SULFATE 325 (65 FE) MG PO TABS
325.0000 mg | ORAL_TABLET | Freq: Three times a day (TID) | ORAL | Status: DC
  Administered 2013-05-09 – 2013-05-10 (×3): 325 mg via ORAL
  Filled 2013-05-09 (×3): qty 1

## 2013-05-09 NOTE — Lactation Note (Signed)
This note was copied from the chart of Jennifer [name]. Lactation Consultation Note Follow up visit at 65 hours of age.  Mom reports last feeding 1 1/2 hr ago and baby asleep in crib.  Prior feeding baby remained latched at breast for about 10 minutes.  Parents preload nipple shield with formula due to poor latch yesterday.  Instructed mom again on nipple shield use, she was not applying well.  AAP supplementation guidelines given and instructed on use for babys age.  Parents are happy with this feeding plan at this time and understand it to be temporary.  Mom is not pumping regularly and not getting colostrum.  Mom feels full and has easily expressed colostrum.  Collected about 3 mls for next feeding.  Encouraged to feed with nipple shield and supplementation, pump on preemie mode and hand express after and to wake baby for feedings every 2 1/2 hours or with feeding cues. Demonstrated waking techniques and ssisted baby to latch in cross cradle without nipple shield baby latches with few sucks, but shallow. Used nipple shield on right breast in football hold.  Mom more independent with this latch.  Encouraged mom to call for latch assist tonight for help prior to possible discharge in the morning.  LC to follow tomorrow with evaluation for pending discharge plan.  [facility].     Patient Name: Jennifer [name] ZOXWR'UToday's Date: 05/09/2013 Reason for consult: Follow-up assessment;Difficult latch   Maternal Data    Feeding Feeding Type: Breast Fed Length of feed:  (about 10 minutes over about 25)  LATCH Score/Interventions Latch: Grasps breast easily, tongue down, lips flanged, rhythmical sucking. Intervention(s): Adjust position;Assist with latch;Breast compression  Audible Swallowing: A few with stimulation Intervention(s): Skin to skin;Hand expression  Type of Nipple: Everted at rest and after stimulation  Comfort (Breast/Nipple): Soft / non-tender     Hold (Positioning):  Assistance needed to correctly position infant at breast and maintain latch. Intervention(s): Breastfeeding basics reviewed;Support Pillows;Position options;Skin to skin  LATCH Score: 8  Lactation Tools Discussed/Used Tools: Nipple Shields Nipple shield size: 24   Consult Status Consult Status: Follow-up Date: 05/10/13 Follow-up type: In-patient    Jennifer [name], Jennifer [name] 05/09/2013, 11:15 PM

## 2013-05-09 NOTE — Progress Notes (Signed)
Subjective: Postpartum Day 2: Cesarean Delivery Patient reports tolerating PO, + flatus and no problems voiding.    Objective: Vital signs in last 24 hours: Temp:  [98 F (36.7 C)-98.2 F (36.8 C)] 98.2 F (36.8 C) (03/10 0528) Pulse Rate:  [78] 78 (03/10 0528) Resp:  [18] 18 (03/10 0528) BP: (105-106)/(66-68) 105/66 mmHg (03/10 0528)  Physical Exam:  General: alert and cooperative Lochia: appropriate Uterine Fundus: firm Incision: honeycomb dressing CDI DVT Evaluation: No evidence of DVT seen on physical exam. Negative Homan's sign. No cords or calf tenderness.   Recent Labs  05/08/13 0555  HGB 8.8*  HCT 26.1*    Assessment/Plan: Status post Cesarean section. Doing well postoperatively.  Continue current care.  Jennifer [name] G 05/09/2013, 8:47 AM

## 2013-05-10 MED ORDER — FERROUS SULFATE 325 (65 FE) MG PO TABS: 325.0000 mg | ORAL_TABLET | Freq: Three times a day (TID) | ORAL | Status: DC

## 2013-05-10 MED ORDER — OXYCODONE-ACETAMINOPHEN 5-325 MG PO TABS: 1.0000 | ORAL_TABLET | ORAL | Status: DC | PRN

## 2013-05-10 MED ORDER — IBUPROFEN 600 MG PO TABS: 600.0000 mg | ORAL_TABLET | Freq: Four times a day (QID) | ORAL | Status: DC

## 2013-05-10 NOTE — Lactation Note (Signed)
This note was copied from the chart of Jennifer Paulina Fusinna Guyton. Lactation Consultation Note Follow up consult:  Mother recently formula fed baby 36 ml and then breastfed for 10 minutes with NS.  Baby asleep.  Encouraged mother to breastfeed first and offer supplement after. Mother's breasts are filling and mother plans to pump after feeding.   Reviewed supply and demand, Baby & Me booklet pp 20-24, voids/stools, milk storage and engorgement care.  Left LC phone number and encouraged parents to call for assistance with next breastfeeding.   Patient Name: Jennifer [name] ZOXWR'UToday's Date: 05/10/2013 Reason for consult: Follow-up assessment   Maternal Data    Feeding Feeding Type: Breast Fed Length of feed: 10 min  LATCH Score/Interventions Latch: Grasps breast easily, tongue down, lips flanged, rhythmical sucking. Intervention(s): Skin to skin (using nipple shields and prefilling with formula)  Audible Swallowing: Spontaneous and intermittent  Type of Nipple: Everted at rest and after stimulation  Comfort (Breast/Nipple): Filling, red/small blisters or bruises, mild/mod discomfort  Problem noted: Mild/Moderate discomfort Interventions (Mild/moderate discomfort): Hand expression;Post-pump  Hold (Positioning): Assistance needed to correctly position infant at breast and maintain latch.  LATCH Score: 8  Lactation Tools Discussed/Used     Consult Status Consult Status: Follow-up Date: 05/11/13 Follow-up type: In-patient    Jennifer [name], Jennifer [name] 05/10/2013, 9:02 AM

## 2013-05-10 NOTE — Progress Notes (Signed)
Post discharge chart review completed.  

## 2013-05-10 NOTE — Lactation Note (Signed)
This note was copied from the chart of Jennifer [name] Castner. Lactation Consultation Note Follow up consult:  Baby 6178 hours old.  Mother able to 10 ml of colostrum after last feeding.     Mother's breast are filling.  Mother able to hand express drops of breastmilk. Assisted mother to place baby in football hold and achieve a deep latch. Rhythmical sucking and swallows observed.  LS 8 due to repostioning and breasts filling. Baby breastfed for 15 minutes on right breast, burped and fed 20 min on left breast.  Reviewed engorgement care. Plan is to view one more feeding to make sure mother is confident latching the baby without the nipple shield.   Reviewed voids/stools and milk storage. Parents are to call to view next feeding.       Patient Name: Jennifer [name] Mcgregor ZOXWR'UToday's Date: 05/10/2013 Reason for consult: Follow-up assessment   Maternal Data    Feeding Feeding Type: Breast Fed Length of feed: 35 min  LATCH Score/Interventions Latch: Grasps breast easily, tongue down, lips flanged, rhythmical sucking. Intervention(s): Waking techniques Intervention(s): Adjust position;Assist with latch;Breast massage  Audible Swallowing: Spontaneous and intermittent Intervention(s): Hand expression  Type of Nipple: Everted at rest and after stimulation  Comfort (Breast/Nipple): Filling, red/small blisters or bruises, mild/mod discomfort  Problem noted: Filling;Mild/Moderate discomfort Interventions (Filling): Frequent nursing  Hold (Positioning): Assistance needed to correctly position infant at breast and maintain latch.  LATCH Score: 8  Lactation Tools Discussed/Used     Consult Status Consult Status: PRN Date: 05/11/13 Follow-up type: In-patient    Dahlia ByesBerkelhammer, Ruth [facility] 05/10/2013, 12:04 PM

## 2013-05-10 NOTE — Lactation Note (Signed)
This note was copied from the chart of Jennifer Paulina Fusinna Setterlund. Lactation Consultation Note Follow up consult:  Parents called to view latch. LS9. Mother placed baby in football hold without NS, no longer using. Sucks and swallows observed.  Breastfed for 20 min. Encouraged deep latch. Mother is engorged, provided ice and a hand pump and reviewed use. Reviewed engorgement care and lactation support services.   Patient Name: Jennifer [name] Reason for consult: Follow-up assessment   Maternal Data    Feeding Feeding Type: Breast Fed Length of feed: 20 min  LATCH Score/Interventions Latch: Grasps breast easily, tongue down, lips flanged, rhythmical sucking. Intervention(s): Skin to skin Intervention(s): Adjust position;Assist with latch;Breast massage  Audible Swallowing: Spontaneous and intermittent Intervention(s): Hand expression  Type of Nipple: Everted at rest and after stimulation  Comfort (Breast/Nipple): Filling, red/small blisters or bruises, mild/mod discomfort  Problem noted: Mild/Moderate discomfort Interventions (Filling): Frequent nursing (ice)  Hold (Positioning): No assistance needed to correctly position infant at breast.  LATCH Score: 9  Lactation Tools Discussed/Used     Consult Status Consult Status: Complete Date: 05/11/13 Follow-up type: In-patient    Jennifer [name], Jennifer [name] [facility] [name], 1:26 PM

## 2013-05-10 NOTE — Discharge Summary (Signed)
Obstetric Discharge Summary Reason for Admission: induction of labor Prenatal Procedures: ultrasound Intrapartum Procedures: cesarean: low cervical, transverse Postpartum Procedures: none Complications-Operative and Postpartum: none Hemoglobin  Date Value Ref Range Status  05/08/2013 8.8* 12.0 - 15.0 g/dL Final     HCT  Date Value Ref Range Status  05/08/2013 26.1* 36.0 - 46.0 % Final    Physical Exam:  General: alert and cooperative Lochia: appropriate Uterine Fundus: firm Incision: healing well DVT Evaluation: No evidence of DVT seen on physical exam. Negative Homan's sign. No cords or calf tenderness. Calf/Ankle edema is present.  Discharge Diagnoses: Term Pregnancy-delivered  Discharge Information: Date: 05/10/2013 Activity: pelvic rest Diet: routine Medications: PNV, Ibuprofen and Percocet Condition: stable Instructions: refer to practice specific booklet Discharge to: home   Newborn Data: Live born female  Birth Weight: 7 lb 12 oz (3515 g) APGAR: 9, 9  Home with mother.  Maia Handa G 05/10/2013, 8:55 AM

## 2013-06-20 ENCOUNTER — Other Ambulatory Visit: Payer: Self-pay | Admitting: Obstetrics and Gynecology

## 2013-12-26 ENCOUNTER — Other Ambulatory Visit: Payer: Self-pay | Admitting: Obstetrics and Gynecology

## 2013-12-27 LAB — CYTOLOGY - PAP

## 2014-01-01 ENCOUNTER — Encounter (HOSPITAL_COMMUNITY): Payer: Self-pay | Admitting: Obstetrics and Gynecology

## 2014-03-15 ENCOUNTER — Encounter (HOSPITAL_COMMUNITY): Payer: Self-pay | Admitting: Obstetrics and Gynecology

## 2014-08-02 ENCOUNTER — Other Ambulatory Visit: Payer: Self-pay | Admitting: Obstetrics and Gynecology

## 2014-08-03 LAB — CYTOLOGY - PAP

## 2014-08-09 ENCOUNTER — Encounter (HOSPITAL_COMMUNITY): Payer: Self-pay | Admitting: Obstetrics and Gynecology

## 2015-09-12 ENCOUNTER — Encounter: Payer: Self-pay | Admitting: Obstetrics & Gynecology

## 2015-09-23 LAB — OB RESULTS CONSOLE GC/CHLAMYDIA
Chlamydia: NEGATIVE
Gonorrhea: NEGATIVE

## 2015-09-23 LAB — OB RESULTS CONSOLE HEPATITIS B SURFACE ANTIGEN: HEP B S AG: NEGATIVE

## 2015-09-23 LAB — OB RESULTS CONSOLE RPR: RPR: NONREACTIVE

## 2015-09-23 LAB — OB RESULTS CONSOLE RUBELLA ANTIBODY, IGM: RUBELLA: IMMUNE

## 2015-09-23 LAB — OB RESULTS CONSOLE HIV ANTIBODY (ROUTINE TESTING): HIV: NONREACTIVE

## 2015-09-23 LAB — OB RESULTS CONSOLE ABO/RH: RH Type: POSITIVE

## 2015-09-23 LAB — OB RESULTS CONSOLE ANTIBODY SCREEN: ANTIBODY SCREEN: NEGATIVE

## 2015-12-26 ENCOUNTER — Other Ambulatory Visit (HOSPITAL_COMMUNITY): Payer: Self-pay | Admitting: Obstetrics & Gynecology

## 2015-12-26 DIAGNOSIS — Q270 Congenital absence and hypoplasia of umbilical artery: Secondary | ICD-10-CM

## 2015-12-26 DIAGNOSIS — O26849 Uterine size-date discrepancy, unspecified trimester: Secondary | ICD-10-CM

## 2015-12-27 ENCOUNTER — Encounter (HOSPITAL_COMMUNITY): Payer: Self-pay | Admitting: Obstetrics & Gynecology

## 2016-01-06 ENCOUNTER — Encounter (HOSPITAL_COMMUNITY): Payer: Self-pay | Admitting: *Deleted

## 2016-01-07 ENCOUNTER — Ambulatory Visit (HOSPITAL_COMMUNITY): Admission: RE | Admit: 2016-01-07 | Payer: Managed Care, Other (non HMO) | Source: Ambulatory Visit

## 2016-01-07 ENCOUNTER — Encounter (HOSPITAL_COMMUNITY): Payer: Self-pay

## 2016-01-07 ENCOUNTER — Ambulatory Visit (HOSPITAL_COMMUNITY)
Admission: RE | Admit: 2016-01-07 | Discharge: 2016-01-07 | Disposition: A | Discharge status: Home / Self Care | Payer: Managed Care, Other (non HMO) | Source: Ambulatory Visit | Attending: Obstetrics & Gynecology | Admitting: Obstetrics & Gynecology

## 2016-01-07 ENCOUNTER — Other Ambulatory Visit (HOSPITAL_COMMUNITY): Payer: Self-pay | Admitting: Obstetrics & Gynecology

## 2016-01-07 DIAGNOSIS — O09819 Supervision of pregnancy resulting from assisted reproductive technology, unspecified trimester: Secondary | ICD-10-CM

## 2016-01-07 DIAGNOSIS — Z363 Encounter for antenatal screening for malformations: Secondary | ICD-10-CM

## 2016-01-07 DIAGNOSIS — Z3A24 24 weeks gestation of pregnancy: Secondary | ICD-10-CM

## 2016-01-07 DIAGNOSIS — O09522 Supervision of elderly multigravida, second trimester: Secondary | ICD-10-CM | POA: Insufficient documentation

## 2016-01-07 DIAGNOSIS — O09812 Supervision of pregnancy resulting from assisted reproductive technology, second trimester: Secondary | ICD-10-CM | POA: Diagnosis not present

## 2016-01-07 DIAGNOSIS — O09899 Supervision of other high risk pregnancies, unspecified trimester: Secondary | ICD-10-CM

## 2016-01-07 DIAGNOSIS — O34219 Maternal care for unspecified type scar from previous cesarean delivery: Secondary | ICD-10-CM | POA: Diagnosis not present

## 2016-01-07 DIAGNOSIS — Q270 Congenital absence and hypoplasia of umbilical artery: Secondary | ICD-10-CM

## 2016-01-07 DIAGNOSIS — O26849 Uterine size-date discrepancy, unspecified trimester: Secondary | ICD-10-CM

## 2016-01-09 ENCOUNTER — Encounter (HOSPITAL_COMMUNITY): Payer: Self-pay

## 2016-01-09 ENCOUNTER — Other Ambulatory Visit (HOSPITAL_COMMUNITY): Payer: Self-pay

## 2016-03-27 LAB — OB RESULTS CONSOLE GBS: GBS: NEGATIVE

## 2016-04-08 ENCOUNTER — Encounter (HOSPITAL_COMMUNITY): Payer: Self-pay

## 2016-04-09 ENCOUNTER — Encounter (HOSPITAL_COMMUNITY): Payer: Self-pay | Admitting: *Deleted

## 2016-04-09 ENCOUNTER — Inpatient Hospital Stay (HOSPITAL_COMMUNITY)
Admission: AD | Admit: 2016-04-09 | Discharge: 2016-04-13 | DRG: 766 | Disposition: A | Discharge status: Home / Self Care | Payer: Managed Care, Other (non HMO) | Source: Ambulatory Visit | Attending: Obstetrics and Gynecology | Admitting: Obstetrics and Gynecology

## 2016-04-09 ENCOUNTER — Telehealth (HOSPITAL_COMMUNITY): Payer: Self-pay | Admitting: *Deleted

## 2016-04-09 DIAGNOSIS — O34211 Maternal care for low transverse scar from previous cesarean delivery: Secondary | ICD-10-CM | POA: Diagnosis present

## 2016-04-09 DIAGNOSIS — Z3A38 38 weeks gestation of pregnancy: Secondary | ICD-10-CM

## 2016-04-09 DIAGNOSIS — E669 Obesity, unspecified: Secondary | ICD-10-CM | POA: Diagnosis present

## 2016-04-09 DIAGNOSIS — Z823 Family history of stroke: Secondary | ICD-10-CM

## 2016-04-09 DIAGNOSIS — Z8249 Family history of ischemic heart disease and other diseases of the circulatory system: Secondary | ICD-10-CM

## 2016-04-09 DIAGNOSIS — O4292 Full-term premature rupture of membranes, unspecified as to length of time between rupture and onset of labor: Secondary | ICD-10-CM | POA: Diagnosis present

## 2016-04-09 DIAGNOSIS — Z87891 Personal history of nicotine dependence: Secondary | ICD-10-CM

## 2016-04-09 DIAGNOSIS — O324XX0 Maternal care for high head at term, not applicable or unspecified: Principal | ICD-10-CM | POA: Diagnosis present

## 2016-04-09 DIAGNOSIS — O99214 Obesity complicating childbirth: Secondary | ICD-10-CM | POA: Diagnosis present

## 2016-04-09 DIAGNOSIS — Z6834 Body mass index (BMI) 34.0-34.9, adult: Secondary | ICD-10-CM

## 2016-04-09 LAB — CBC
HEMATOCRIT: 35.9 % — AB (ref 36.0–46.0)
HEMOGLOBIN: 12.3 g/dL (ref 12.0–15.0)
MCH: 29.6 pg (ref 26.0–34.0)
MCHC: 34.3 g/dL (ref 30.0–36.0)
MCV: 86.5 fL (ref 78.0–100.0)
Platelets: 217 10*3/uL (ref 150–400)
RBC: 4.15 MIL/uL (ref 3.87–5.11)
RDW: 13.1 % (ref 11.5–15.5)
WBC: 9.5 10*3/uL (ref 4.0–10.5)

## 2016-04-09 LAB — POCT FERN TEST: POCT Fern Test: POSITIVE

## 2016-04-09 MED ORDER — LACTATED RINGERS IV SOLN
500.0000 mL | INTRAVENOUS | Status: DC | PRN
  Administered 2016-04-10: 500 mL via INTRAVENOUS

## 2016-04-09 MED ORDER — OXYTOCIN BOLUS FROM INFUSION: 500.0000 mL | Freq: Once | INTRAVENOUS | Status: DC

## 2016-04-09 MED ORDER — OXYTOCIN 40 UNITS IN LACTATED RINGERS INFUSION - SIMPLE MED
2.5000 [IU]/h | INTRAVENOUS | Status: DC
  Filled 2016-04-09: qty 1000

## 2016-04-09 MED ORDER — LACTATED RINGERS IV SOLN
INTRAVENOUS | Status: DC
  Administered 2016-04-10: via INTRAVENOUS
  Administered 2016-04-10: 125 mL/h via INTRAVENOUS

## 2016-04-09 NOTE — MAU Note (Addendum)
PT  SAYS SROM   AT  945PM-  CLEAR.    FIRST  BABY  WAS  C/S-   BUT PLAN  FOR  THIS  TO BE  VAG.   BUT  WAS  BREECH  LAST  WEEK     - VE -  NONE.Marland Kitchen.    DENIES HSV AND  MRSA.  GBS- NEG   FEELS  SOME  UC.

## 2016-04-09 NOTE — Telephone Encounter (Signed)
Preadmission screen  

## 2016-04-10 ENCOUNTER — Encounter (HOSPITAL_COMMUNITY): Payer: Self-pay

## 2016-04-10 ENCOUNTER — Inpatient Hospital Stay (HOSPITAL_COMMUNITY): Payer: Managed Care, Other (non HMO) | Admitting: Anesthesiology

## 2016-04-10 ENCOUNTER — Encounter (HOSPITAL_COMMUNITY)
Admission: AD | Disposition: A | Discharge status: Home / Self Care | Payer: Self-pay | Source: Ambulatory Visit | Attending: Obstetrics and Gynecology

## 2016-04-10 DIAGNOSIS — O99214 Obesity complicating childbirth: Secondary | ICD-10-CM | POA: Diagnosis present

## 2016-04-10 DIAGNOSIS — O4292 Full-term premature rupture of membranes, unspecified as to length of time between rupture and onset of labor: Secondary | ICD-10-CM | POA: Diagnosis present

## 2016-04-10 DIAGNOSIS — O34211 Maternal care for low transverse scar from previous cesarean delivery: Secondary | ICD-10-CM | POA: Diagnosis present

## 2016-04-10 DIAGNOSIS — Z3A38 38 weeks gestation of pregnancy: Secondary | ICD-10-CM | POA: Diagnosis not present

## 2016-04-10 DIAGNOSIS — Z823 Family history of stroke: Secondary | ICD-10-CM | POA: Diagnosis not present

## 2016-04-10 DIAGNOSIS — Z87891 Personal history of nicotine dependence: Secondary | ICD-10-CM | POA: Diagnosis not present

## 2016-04-10 DIAGNOSIS — Z6834 Body mass index (BMI) 34.0-34.9, adult: Secondary | ICD-10-CM | POA: Diagnosis not present

## 2016-04-10 DIAGNOSIS — E669 Obesity, unspecified: Secondary | ICD-10-CM | POA: Diagnosis present

## 2016-04-10 DIAGNOSIS — Z8249 Family history of ischemic heart disease and other diseases of the circulatory system: Secondary | ICD-10-CM | POA: Diagnosis not present

## 2016-04-10 DIAGNOSIS — O324XX0 Maternal care for high head at term, not applicable or unspecified: Secondary | ICD-10-CM | POA: Diagnosis present

## 2016-04-10 LAB — TYPE AND SCREEN
ABO/RH(D): A POS
Antibody Screen: NEGATIVE

## 2016-04-10 SURGERY — Surgical Case
Anesthesia: Epidural

## 2016-04-10 MED ORDER — PHENYLEPHRINE 40 MCG/ML (10ML) SYRINGE FOR IV PUSH (FOR BLOOD PRESSURE SUPPORT): 80.0000 ug | PREFILLED_SYRINGE | INTRAVENOUS | Status: DC | PRN

## 2016-04-10 MED ORDER — MEPERIDINE HCL 25 MG/ML IJ SOLN: 6.2500 mg | INTRAMUSCULAR | Status: DC | PRN

## 2016-04-10 MED ORDER — LIDOCAINE HCL (PF) 1 % IJ SOLN
30.0000 mL | INTRAMUSCULAR | Status: DC | PRN
  Filled 2016-04-10: qty 30

## 2016-04-10 MED ORDER — SODIUM CHLORIDE 0.9% FLUSH: 3.0000 mL | INTRAVENOUS | Status: DC | PRN

## 2016-04-10 MED ORDER — NALBUPHINE HCL 10 MG/ML IJ SOLN: 5.0000 mg | Freq: Once | INTRAMUSCULAR | Status: DC | PRN

## 2016-04-10 MED ORDER — KETOROLAC TROMETHAMINE 30 MG/ML IJ SOLN
30.0000 mg | Freq: Four times a day (QID) | INTRAMUSCULAR | Status: AC | PRN
  Administered 2016-04-10: 30 mg via INTRAMUSCULAR

## 2016-04-10 MED ORDER — MORPHINE SULFATE (PF) 0.5 MG/ML IJ SOLN
INTRAMUSCULAR | Status: DC | PRN
  Administered 2016-04-10: 1 mg via INTRAVENOUS
  Administered 2016-04-10: 4 mg via EPIDURAL

## 2016-04-10 MED ORDER — WITCH HAZEL-GLYCERIN EX PADS: 1.0000 "application " | MEDICATED_PAD | CUTANEOUS | Status: DC | PRN

## 2016-04-10 MED ORDER — SIMETHICONE 80 MG PO CHEW
80.0000 mg | CHEWABLE_TABLET | ORAL | Status: DC
  Administered 2016-04-10 – 2016-04-12 (×3): 80 mg via ORAL
  Filled 2016-04-10 (×3): qty 1

## 2016-04-10 MED ORDER — KETOROLAC TROMETHAMINE 30 MG/ML IJ SOLN
INTRAMUSCULAR | Status: AC
  Filled 2016-04-10: qty 1

## 2016-04-10 MED ORDER — PHENYLEPHRINE 40 MCG/ML (10ML) SYRINGE FOR IV PUSH (FOR BLOOD PRESSURE SUPPORT)
PREFILLED_SYRINGE | INTRAVENOUS | Status: AC
  Filled 2016-04-10: qty 20

## 2016-04-10 MED ORDER — DEXTROSE 5 % IV SOLN
2.0000 g | Freq: Once | INTRAVENOUS | Status: AC
  Administered 2016-04-10: 2 g via INTRAVENOUS
  Filled 2016-04-10: qty 2

## 2016-04-10 MED ORDER — ACETAMINOPHEN 325 MG PO TABS
650.0000 mg | ORAL_TABLET | ORAL | Status: DC | PRN
  Administered 2016-04-10: 650 mg via ORAL
  Filled 2016-04-10: qty 2

## 2016-04-10 MED ORDER — ZOLPIDEM TARTRATE 5 MG PO TABS: 5.0000 mg | ORAL_TABLET | Freq: Every evening | ORAL | Status: DC | PRN

## 2016-04-10 MED ORDER — LACTATED RINGERS IV SOLN
INTRAVENOUS | Status: DC | PRN
  Administered 2016-04-10: 16:00:00 via INTRAVENOUS

## 2016-04-10 MED ORDER — SOD CITRATE-CITRIC ACID 500-334 MG/5ML PO SOLN
30.0000 mL | ORAL | Status: DC | PRN
  Administered 2016-04-10: 30 mL via ORAL
  Filled 2016-04-10: qty 15

## 2016-04-10 MED ORDER — PHENYLEPHRINE HCL 10 MG/ML IJ SOLN
INTRAMUSCULAR | Status: DC | PRN
  Administered 2016-04-10 (×10): 80 ug via INTRAVENOUS

## 2016-04-10 MED ORDER — ONDANSETRON HCL 4 MG/2ML IJ SOLN
INTRAMUSCULAR | Status: AC
  Filled 2016-04-10: qty 2

## 2016-04-10 MED ORDER — DIBUCAINE 1 % RE OINT: 1.0000 "application " | TOPICAL_OINTMENT | RECTAL | Status: DC | PRN

## 2016-04-10 MED ORDER — LIDOCAINE HCL (PF) 1 % IJ SOLN
INTRAMUSCULAR | Status: DC | PRN
  Administered 2016-04-10 (×2): 4 mL via EPIDURAL

## 2016-04-10 MED ORDER — LIDOCAINE-EPINEPHRINE 2 %-1:100000 IJ SOLN
INTRAMUSCULAR | Status: DC | PRN
  Administered 2016-04-10 (×4): 5 mL via INTRADERMAL

## 2016-04-10 MED ORDER — PROMETHAZINE HCL 25 MG/ML IJ SOLN: 6.2500 mg | INTRAMUSCULAR | Status: DC | PRN

## 2016-04-10 MED ORDER — NALBUPHINE HCL 10 MG/ML IJ SOLN: 5.0000 mg | INTRAMUSCULAR | Status: DC | PRN

## 2016-04-10 MED ORDER — MIDAZOLAM HCL 2 MG/2ML IJ SOLN: 0.5000 mg | Freq: Once | INTRAMUSCULAR | Status: DC | PRN

## 2016-04-10 MED ORDER — SIMETHICONE 80 MG PO CHEW
80.0000 mg | CHEWABLE_TABLET | Freq: Three times a day (TID) | ORAL | Status: DC
  Administered 2016-04-11 – 2016-04-13 (×6): 80 mg via ORAL
  Filled 2016-04-10 (×6): qty 1

## 2016-04-10 MED ORDER — LACTATED RINGERS IV SOLN
500.0000 mL | Freq: Once | INTRAVENOUS | Status: AC
  Administered 2016-04-10: 500 mL via INTRAVENOUS

## 2016-04-10 MED ORDER — SIMETHICONE 80 MG PO CHEW
80.0000 mg | CHEWABLE_TABLET | ORAL | Status: DC | PRN
Start: 2016-04-10 — End: 2016-04-13
  Administered 2016-04-10: 80 mg via ORAL
  Filled 2016-04-10: qty 1

## 2016-04-10 MED ORDER — IBUPROFEN 600 MG PO TABS
600.0000 mg | ORAL_TABLET | Freq: Four times a day (QID) | ORAL | Status: DC
  Administered 2016-04-10 – 2016-04-13 (×11): 600 mg via ORAL
  Filled 2016-04-10 (×11): qty 1

## 2016-04-10 MED ORDER — TERBUTALINE SULFATE 1 MG/ML IJ SOLN: 0.2500 mg | Freq: Once | INTRAMUSCULAR | Status: DC | PRN

## 2016-04-10 MED ORDER — FENTANYL 2.5 MCG/ML BUPIVACAINE 1/10 % EPIDURAL INFUSION (WH - ANES)
INTRAMUSCULAR | Status: AC
  Filled 2016-04-10: qty 100

## 2016-04-10 MED ORDER — LACTATED RINGERS IV SOLN
INTRAVENOUS | Status: DC | PRN
  Administered 2016-04-10 (×2): via INTRAVENOUS

## 2016-04-10 MED ORDER — SCOPOLAMINE 1 MG/3DAYS TD PT72
MEDICATED_PATCH | TRANSDERMAL | Status: DC | PRN
  Administered 2016-04-10: 1 via TRANSDERMAL

## 2016-04-10 MED ORDER — OXYTOCIN 10 UNIT/ML IJ SOLN
INTRAMUSCULAR | Status: AC
  Filled 2016-04-10: qty 4

## 2016-04-10 MED ORDER — OXYTOCIN 40 UNITS IN LACTATED RINGERS INFUSION - SIMPLE MED: 1.0000 m[IU]/min | INTRAVENOUS | Status: DC

## 2016-04-10 MED ORDER — ACETAMINOPHEN 325 MG PO TABS: 650.0000 mg | ORAL_TABLET | ORAL | Status: DC | PRN

## 2016-04-10 MED ORDER — ALBUMIN HUMAN 5 % IV SOLN
INTRAVENOUS | Status: DC | PRN
  Administered 2016-04-10 (×2): via INTRAVENOUS

## 2016-04-10 MED ORDER — FENTANYL 2.5 MCG/ML BUPIVACAINE 1/10 % EPIDURAL INFUSION (WH - ANES)
14.0000 mL/h | INTRAMUSCULAR | Status: DC | PRN
  Administered 2016-04-10 (×2): 14 mL/h via EPIDURAL
  Filled 2016-04-10: qty 100

## 2016-04-10 MED ORDER — DIPHENHYDRAMINE HCL 50 MG/ML IJ SOLN: 12.5000 mg | INTRAMUSCULAR | Status: DC | PRN

## 2016-04-10 MED ORDER — EPHEDRINE 5 MG/ML INJ: 10.0000 mg | INTRAVENOUS | Status: DC | PRN

## 2016-04-10 MED ORDER — TETANUS-DIPHTH-ACELL PERTUSSIS 5-2.5-18.5 LF-MCG/0.5 IM SUSP: 0.5000 mL | Freq: Once | INTRAMUSCULAR | Status: DC

## 2016-04-10 MED ORDER — NALOXONE HCL 2 MG/2ML IJ SOSY: 1.0000 ug/kg/h | PREFILLED_SYRINGE | INTRAVENOUS | Status: DC | PRN

## 2016-04-10 MED ORDER — OXYCODONE-ACETAMINOPHEN 5-325 MG PO TABS: 1.0000 | ORAL_TABLET | ORAL | Status: DC | PRN

## 2016-04-10 MED ORDER — SCOPOLAMINE 1 MG/3DAYS TD PT72
MEDICATED_PATCH | TRANSDERMAL | Status: AC
  Filled 2016-04-10: qty 1

## 2016-04-10 MED ORDER — ALBUMIN HUMAN 5 % IV SOLN
INTRAVENOUS | Status: AC
  Filled 2016-04-10: qty 250

## 2016-04-10 MED ORDER — OXYCODONE-ACETAMINOPHEN 5-325 MG PO TABS: 2.0000 | ORAL_TABLET | ORAL | Status: DC | PRN

## 2016-04-10 MED ORDER — ONDANSETRON HCL 4 MG/2ML IJ SOLN: 4.0000 mg | Freq: Four times a day (QID) | INTRAMUSCULAR | Status: DC | PRN

## 2016-04-10 MED ORDER — MORPHINE SULFATE (PF) 4 MG/ML IV SOLN: 1.0000 mg | INTRAVENOUS | Status: DC | PRN

## 2016-04-10 MED ORDER — DIPHENHYDRAMINE HCL 25 MG PO CAPS: 25.0000 mg | ORAL_CAPSULE | Freq: Four times a day (QID) | ORAL | Status: DC | PRN

## 2016-04-10 MED ORDER — FLEET ENEMA 7-19 GM/118ML RE ENEM: 1.0000 | ENEMA | RECTAL | Status: DC | PRN

## 2016-04-10 MED ORDER — OXYTOCIN 40 UNITS IN LACTATED RINGERS INFUSION - SIMPLE MED: 2.5000 [IU]/h | INTRAVENOUS | Status: AC

## 2016-04-10 MED ORDER — LACTATED RINGERS IV SOLN: INTRAVENOUS | Status: DC

## 2016-04-10 MED ORDER — PRENATAL MULTIVITAMIN CH
1.0000 | ORAL_TABLET | Freq: Every day | ORAL | Status: DC
  Administered 2016-04-11 – 2016-04-13 (×3): 1 via ORAL
  Filled 2016-04-10 (×3): qty 1

## 2016-04-10 MED ORDER — SODIUM CHLORIDE 0.9 % IR SOLN
Status: DC | PRN
  Administered 2016-04-10: 1

## 2016-04-10 MED ORDER — NALOXONE HCL 0.4 MG/ML IJ SOLN: 0.4000 mg | INTRAMUSCULAR | Status: DC | PRN

## 2016-04-10 MED ORDER — MORPHINE SULFATE (PF) 0.5 MG/ML IJ SOLN
INTRAMUSCULAR | Status: AC
  Filled 2016-04-10: qty 10

## 2016-04-10 MED ORDER — DIPHENHYDRAMINE HCL 25 MG PO CAPS: 25.0000 mg | ORAL_CAPSULE | ORAL | Status: DC | PRN

## 2016-04-10 MED ORDER — MENTHOL 3 MG MT LOZG: 1.0000 | LOZENGE | OROMUCOSAL | Status: DC | PRN

## 2016-04-10 MED ORDER — COCONUT OIL OIL: 1.0000 "application " | TOPICAL_OIL | Status: DC | PRN

## 2016-04-10 MED ORDER — KETOROLAC TROMETHAMINE 30 MG/ML IJ SOLN: 30.0000 mg | Freq: Four times a day (QID) | INTRAMUSCULAR | Status: AC | PRN

## 2016-04-10 MED ORDER — OXYTOCIN 10 UNIT/ML IJ SOLN
INTRAVENOUS | Status: DC | PRN
  Administered 2016-04-10: 40 [IU] via INTRAVENOUS

## 2016-04-10 MED ORDER — SENNOSIDES-DOCUSATE SODIUM 8.6-50 MG PO TABS
2.0000 | ORAL_TABLET | ORAL | Status: DC
  Administered 2016-04-10 – 2016-04-12 (×3): 2 via ORAL
  Filled 2016-04-10 (×3): qty 2

## 2016-04-10 MED ORDER — SCOPOLAMINE 1 MG/3DAYS TD PT72: 1.0000 | MEDICATED_PATCH | Freq: Once | TRANSDERMAL | Status: DC

## 2016-04-10 MED ORDER — ONDANSETRON HCL 4 MG/2ML IJ SOLN
INTRAMUSCULAR | Status: DC | PRN
  Administered 2016-04-10: 4 mg via INTRAVENOUS

## 2016-04-10 MED ORDER — ONDANSETRON HCL 4 MG/2ML IJ SOLN: 4.0000 mg | Freq: Three times a day (TID) | INTRAMUSCULAR | Status: DC | PRN

## 2016-04-10 MED ORDER — OXYTOCIN 40 UNITS IN LACTATED RINGERS INFUSION - SIMPLE MED
1.0000 m[IU]/min | INTRAVENOUS | Status: DC
  Administered 2016-04-10: 2 m[IU]/min via INTRAVENOUS

## 2016-04-10 MED ORDER — LACTATED RINGERS IV BOLUS (SEPSIS)
1000.0000 mL | Freq: Once | INTRAVENOUS | Status: AC
  Administered 2016-04-10: 1000 mL via INTRAVENOUS

## 2016-04-10 SURGICAL SUPPLY — 29 items
CHLORAPREP W/TINT 26ML (MISCELLANEOUS) ×3 IMPLANT
CLAMP CORD UMBIL (MISCELLANEOUS) IMPLANT
CLOTH BEACON ORANGE TIMEOUT ST (SAFETY) ×3 IMPLANT
DRSG OPSITE POSTOP 4X10 (GAUZE/BANDAGES/DRESSINGS) ×3 IMPLANT
ELECT REM PT RETURN 9FT ADLT (ELECTROSURGICAL) ×3
ELECTRODE REM PT RTRN 9FT ADLT (ELECTROSURGICAL) ×1 IMPLANT
EXTRACTOR VACUUM M CUP 4 TUBE (SUCTIONS) IMPLANT
EXTRACTOR VACUUM M CUP 4' TUBE (SUCTIONS)
GLOVE BIOGEL PI IND STRL 7.0 (GLOVE) ×1 IMPLANT
GLOVE BIOGEL PI INDICATOR 7.0 (GLOVE) ×2
GLOVE SURG ORTHO 8.0 STRL STRW (GLOVE) ×3 IMPLANT
GOWN STRL REUS W/TWL LRG LVL3 (GOWN DISPOSABLE) ×6 IMPLANT
KIT ABG SYR 3ML LUER SLIP (SYRINGE) ×3 IMPLANT
NEEDLE HYPO 25X5/8 SAFETYGLIDE (NEEDLE) ×3 IMPLANT
NS IRRIG 1000ML POUR BTL (IV SOLUTION) ×3 IMPLANT
PACK C SECTION WH (CUSTOM PROCEDURE TRAY) ×3 IMPLANT
PAD OB MATERNITY 4.3X12.25 (PERSONAL CARE ITEMS) ×3 IMPLANT
PENCIL SMOKE EVAC W/HOLSTER (ELECTROSURGICAL) ×3 IMPLANT
RTRCTR C-SECT PINK 25CM LRG (MISCELLANEOUS) ×3 IMPLANT
SPONGE LAP 18X18 X RAY DECT (DISPOSABLE) ×12 IMPLANT
SUT MNCRL 0 VIOLET CTX 36 (SUTURE) ×3 IMPLANT
SUT MON AB 4-0 PS1 27 (SUTURE) ×3 IMPLANT
SUT MONOCRYL 0 CTX 36 (SUTURE) ×6
SUT PDS AB 1 CT  36 (SUTURE)
SUT PDS AB 1 CT 36 (SUTURE) IMPLANT
SUT VIC AB 1 CTX 36 (SUTURE)
SUT VIC AB 1 CTX36XBRD ANBCTRL (SUTURE) IMPLANT
TOWEL OR 17X24 6PK STRL BLUE (TOWEL DISPOSABLE) ×3 IMPLANT
TRAY FOLEY CATH SILVER 14FR (SET/KITS/TRAYS/PACK) ×3 IMPLANT

## 2016-04-10 NOTE — Progress Notes (Signed)
Left message with Dr. Rana SnareLowe to call [phone] regarding Pt Haywards output.

## 2016-04-10 NOTE — Anesthesia Preprocedure Evaluation (Signed)
Anesthesia Evaluation  Patient identified by MRN, date of birth, ID band Patient awake    Reviewed: Allergy & Precautions, Patient's Chart, lab work & pertinent test results  Airway Mallampati: II  TM Distance: >3 FB Neck ROM: Full    Dental no notable dental hx. (+) Teeth Intact   Pulmonary former smoker,    Pulmonary exam normal breath sounds clear to auscultation       Cardiovascular negative cardio ROS Normal cardiovascular exam Rhythm:Regular Rate:Normal     Neuro/Psych negative neurological ROS  negative psych ROS   GI/Hepatic negative GI ROS, Neg liver ROS,   Endo/Other  Obesity  Renal/GU negative Renal ROS  negative genitourinary   Musculoskeletal negative musculoskeletal ROS (+)   Abdominal (+) + obese,   Peds  Hematology negative hematology ROS (+)   Anesthesia Other Findings   Reproductive/Obstetrics (+) Pregnancy AMA IVF Previous C/Section - in labor                             Anesthesia Physical Anesthesia Plan  ASA: II  Anesthesia Plan: Epidural   Post-op Pain Management:    Induction:   Airway Management Planned: Natural Airway  Additional Equipment:   Intra-op Plan:   Post-operative Plan:   Informed Consent: I have reviewed the patients History and Physical, chart, labs and discussed the procedure including the risks, benefits and alternatives for the proposed anesthesia with the patient or authorized representative who has indicated his/her understanding and acceptance.     Plan Discussed with: Anesthesiologist  Anesthesia Plan Comments:         Anesthesia Quick Evaluation

## 2016-04-10 NOTE — Anesthesia Procedure Notes (Signed)
Epidural Patient location during procedure: OB Start time: 04/10/2016 6:30 AM  Staffing Anesthesiologist: Mal AmabileFOSTER, Edwinna Rochette  Preanesthetic Checklist Completed: patient identified, site marked, surgical consent, pre-op evaluation, timeout performed, IV checked, risks and benefits discussed and monitors and equipment checked  Epidural Patient position: sitting Prep: site prepped and draped and DuraPrep Patient monitoring: continuous pulse ox and blood pressure Approach: midline Location: L4-L5 Injection technique: LOR air  Needle:  Needle type: Tuohy  Needle gauge: 17 G Needle length: 9 cm and 9 Needle insertion depth: 6 cm Catheter type: closed end flexible Catheter size: 19 Gauge Catheter at skin depth: 11 cm Test dose: negative and Other  Assessment Events: blood not aspirated, injection not painful, no injection resistance, negative IV test and no paresthesia  Additional Notes Patient identified. Risks and benefits discussed including failed block, incomplete  Pain control, post dural puncture headache, nerve damage, paralysis, blood pressure Changes, nausea, vomiting, reactions to medications-both toxic and allergic and post Partum back pain. All questions were answered. Patient expressed understanding and wished to proceed. Sterile technique was used throughout procedure. Epidural site was Dressed with sterile barrier dressing. No paresthesias, signs of intravascular injection Or signs of intrathecal spread were encountered.  Patient was more comfortable after the epidural was dosed. Please see RN's note for documentation of vital signs and FHR which are stable.

## 2016-04-10 NOTE — Lactation Note (Signed)
This note was copied from a baby's chart. Lactation Consultation Note  Patient Name: Jennifer [name] UAUEB'V Date: 04/10/2016 Reason for consult: Initial assessment;Infant < 6lbs   Pump and pump kit placed in room, requested Gordy Councilman, RN to set up pump later this evening.    Maternal Data Formula Feeding for Exclusion: Yes Reason for exclusion: Mother's choice to formula and breast feed on admission Has patient been taught Hand Expression?: No Does the patient have breastfeeding experience prior to this delivery?: Yes  Feeding Feeding Type: Breast Fed Length of feed: 0 min  LATCH Score/Interventions Latch: Too sleepy or reluctant, no latch achieved, no sucking elicited. Intervention(s): Skin to skin;Teach feeding cues;Waking techniques  Audible Swallowing: None  Type of Nipple: Everted at rest and after stimulation  Comfort (Breast/Nipple): Soft / non-tender     Hold (Positioning): Full assist, staff holds infant at breast Intervention(s): Breastfeeding basics reviewed;Support Pillows;Position options;Skin to skin  LATCH Score: 4  Lactation Tools Discussed/Used WIC Program: No   Consult Status Consult Status: Follow-up Date: 04/10/16 Follow-up type: In-patient    Debby Freiberg Manila Rommel 04/10/2016, 6:22 PM

## 2016-04-10 NOTE — Lactation Note (Signed)
This note was copied from a baby's chart. Lactation Consultation Note  Patient Name: Jennifer [name]: 04/10/2016 Reason for consult: Initial assessment;Infant < 6lbs   Initial assessment with Exp BF mom of 1 hour old infant in PACU. Infant weight 5 lb 1.5 oz. Infant product of IVF. Mom reports she had difficulty latching her 753 yo son and pumped and bottle fed for 2 months.   Infant with low tone, she was cueing to feed. Assisted mom in latching to left breast, infant would attempt to latch and was not able to latch on. Infant in noted to have a small mouth and mom with large nipple. Mom with firm breasts and semi compressible areola with everted nipples. Glistening of colostrum noted to right breast with hand expression and none noted on left. After exam by NNP again tried to latch infant to left breast without success. Infant left STS with mom.   Mom is planning to breast and formula feed. Burnis KingfisherL. Rafeek, NNP prefers infant to get 22 cal formula. LPT infant policy handout given and explained to parents based on infant size. Read instructions to parents. Enc them to decrease stim, keep hat on at all times, feed infant at least every 3 hours with no longer than 30 minutes per feeding. Discussed supplementation amounts with family. Feeding log given and explained to parents. Discussed with mom setting up pump later today to stimulate milk production. Discussed always giving EBM prior to formula. Discussed using bottle to feed infant due to size and low tone.   BF Resources Handout and LC Brochure given, mom informed of IP/OP Services, BF Support Groups and LC phone #. Enc mom to call out for feeding assistance as needed. Mom has a PIS at home. Parents were informed that infant may need to stay longer to work on feeds due to size by [facility] and NNP.         Maternal Data Formula Feeding for Exclusion: Yes Reason for exclusion: Mother's choice to formula and breast feed on admission Has patient  been taught Hand Expression?: No Does the patient have breastfeeding experience prior to this delivery?: Yes  Feeding Feeding Type: Breast Fed Length of feed: 0 min  LATCH Score/Interventions Latch: Too sleepy or reluctant, no latch achieved, no sucking elicited. Intervention(s): Skin to skin;Teach feeding cues;Waking techniques  Audible Swallowing: None  Type of Nipple: Everted at rest and after stimulation  Comfort (Breast/Nipple): Soft / non-tender     Hold (Positioning): Full assist, staff holds infant at breast Intervention(s): Breastfeeding basics reviewed;Support Pillows;Position options;Skin to skin  LATCH Score: 4  Lactation Tools Discussed/Used WIC Program: No   Consult Status Consult Status: Follow-up [name]: 04/10/16 Follow-up type: In-patient    Silas FloodSharon S Baudelio Karnes 04/10/2016, 5:30 PM

## 2016-04-10 NOTE — Transfer of Care (Signed)
Immediate Anesthesia Transfer of Care Note  Patient: Jennifer [name]  Procedure(s) Performed: Procedure(s): CESAREAN SECTION (N/A)  Patient Location: PACU  Anesthesia Type:Epidural  Level of Consciousness: awake, alert  and oriented  Airway & Oxygen Therapy: Patient Spontanous Breathing  Post-op Assessment: Report given to RN and Post -op Vital signs reviewed and stable  Post vital signs: Reviewed and stable  Last Vitals:  Vitals:   04/10/16 1432 04/10/16 1459  BP: 129/81 127/78  Pulse: 84 79  Resp: 18   Temp:      Last Pain:  Vitals:   04/10/16 1138  TempSrc:   PainSc: 6       Patients Stated Pain Goal: 6 (04/10/16 0031)  Complications: No apparent anesthesia complications

## 2016-04-10 NOTE — MAU Note (Signed)
Notified provider that patient is here for SROM at 2145 clear fluid(fern positive). Patient is vertex by bedside ultrasound(Heather Mathews RobinsonsHogan CNM). Provider said to admit patient to L&D.

## 2016-04-10 NOTE — Anesthesia Pain Management Evaluation Note (Signed)
  CRNA Pain Management Visit Note  Patient: Jennifer [name], 36 y.o., female  "Hello [facility]. We have an anesthesia team available at all times to provide care throughout the hospital, including epidural management and anesthesia for C-section. I don't know your plan for the delivery whether it a natural birth, water birth, IV sedation, nitrous supplementation, doula or epidural, but we want to meet your pain goals."   1.Was your pain managed to your expectations on prior hospitalizations?   Yes   2.What is your expectation for pain management during this hospitalization?     Epidural  3.How can we help you reach that goal? Epidural in situ.  Record the patient's initial score and the patient's pain goal.   Pain: 0  Pain Goal: 6 [facility] wants you to be able to say your pain was always managed very well.  Jennifer [name] L 04/10/2016

## 2016-04-10 NOTE — Progress Notes (Signed)
Patient ID: Jennifer [name], female   DOB: Nov 07, 1980, 36 y.o.   MRN: [id number] Pt comfortable with epidural Pushing x 1.5 hours with no descent of vertex C/C/-3 per my exam and OP (previously rotated from ROP to LOA, now reverted back) FHR 140s with accels and mod variables  Arrest of descent with prev C/S Plan repeat C/S.  R&B discussed and informed consent obtained DL

## 2016-04-10 NOTE — Op Note (Signed)
Cesarean Section Procedure Note  Pre-operative Diagnosis: IUP at term, arrest of descent, previous c/s  Post-operative Diagnosis: same  Surgeon: Turner DanielsLOWE,Nyja Westbrook C   Assistants: none  Anesthesia: epidural  Procedure:  Repeat Low Segment Transverse cesarean section  Procedure Details  The patient was seen in the Holding Room. The risks, benefits, complications, treatment options, and expected outcomes were discussed with the patient.  The patient concurred with the proposed plan, giving informed consent.  The site of surgery properly noted/marked.. A Time Out was held and the above information confirmed.  After induction of anesthesia, the patient was draped and prepped in the usual sterile manner. A Pfannenstiel incision was made and carried down through the subcutaneous tissue to the fascia. Fascial incision was made and extended transversely. The fascia was separated from the underlying rectus tissue superiorly and inferiorly. The peritoneum was identified and entered. Peritoneal incision was extended longitudinally. The utero-vesical peritoneal reflection was incised transversely and the bladder flap was bluntly freed from the lower uterine segment. A low transverse uterine incision was made. Delivered from vertex OP presentation was a baby with Apgar scores of 6 at one minute and 9 at five minutes. After the umbilical cord was clamped and cut cord blood was obtained for evaluation. The placenta was removed intact and appeared normal. The uterine outline, tubes and ovaries appeared normal. The uterine incision was closed with running locked sutures of 0 monocryl and imbricated with 0 monocryl. Hemostasis was observed. Lavage was carried out until clear. The peritoneum was then closed with 0 monocryl and rectus muscles plicated in the midline.  After hemostasis was assured, the fascia was then reapproximated with running sutures of 0 PDS. Irrigation was applied and after adequate hemostasis was assured,  the skin was reapproximated with subcutaneous sutures using 4-0 monocryl.  Instrument, sponge, and needle counts were correct prior the abdominal closure and at the conclusion of the case. The patient received 2 grams cefotetan preoperatively.  Findings: Viable female  Estimated Blood Loss:  1200cc         Specimens: Placenta was sent to labor and delivery         Complications:  None

## 2016-04-10 NOTE — H&P (Addendum)
Jennifer [name] is a 36 y.o. female presenting for SROM. OB History    Gravida Para Term Preterm AB Living   4 1 1   2 1    SAB TAB Ectopic Multiple Live Births   1 1     1      Past Medical History:  Diagnosis Date  . AMA (advanced maternal age) multigravida 35+   . History of kidney stones   . Newborn product of IVF pregnancy   . Vaginal Pap smear, abnormal    Past Surgical History:  Procedure Laterality Date  . CESAREAN SECTION N/A 05/07/2013   Procedure: PRIMARY CESAREAN SECTION;  Surgeon: Turner Danielsavid C Lowe, MD;  Location: [facility];  Service: Obstetrics;  Laterality: N/A;  . COLPOSCOPY    . DILATION AND EVACUATION     Family History: family history includes Cancer in her maternal grandmother, mother, and paternal grandmother; Heart attack in her paternal grandfather and paternal grandmother; Stroke in her paternal grandfather. Social History:  reports that she quit smoking about 7 years ago. She has never used smokeless tobacco. She reports that she does not drink alcohol or use drugs.     Maternal Diabetes: No Genetic Screening: Normal Maternal Ultrasounds/Referrals: Normal Fetal Ultrasounds or other Referrals:  Other: 2V cord Maternal Substance Abuse:  No Significant Maternal Medications:  None Significant Maternal Lab Results:  None Other Comments:  None  ROS History Dilation: 1 Effacement (%): 50 Station: -3 Exam by:: C. Leshowitz, RN Blood pressure 131/81, pulse 81, temperature 98.6 F (37 C), temperature source Oral, resp. rate 18, height 5\' 6"  (1.676 m), weight 96.6 kg (213 lb), unknown if currently breastfeeding. Exam Physical Exam  (from clinic) NAD, A&O NWOB Abd soft, nondistended, gravid SVE 1/50/-3  Prenatal labs: ABO, Rh: --/--/A POS (02/08 2342) Antibody: NEG (02/08 2342) Rubella: Immune (07/24 0000) RPR: Nonreactive (07/24 0000)  HBsAg: Negative (07/24 0000)  HIV: Non-reactive (07/24 0000)  GBS: Negative (01/26 0000)   Assessment/Plan: 36 yo  G4P1021 @ 38.0wga presents s/p SROM. Desires VTOLAC - aware of risks including 1.5% chance of uterine rupture including 10% risk of neonatal morbidity/mortality if that were to happen - consent signed in clinic. Pt desires to proceed with VTOLAC. Plan for pitocin given no cervical change over two hours of admission. 2 vessel cord. Cat 1 FHT. GBS neg.    Jennifer [name] 04/10/2016, 2:58 AM

## 2016-04-11 LAB — CBC WITH DIFFERENTIAL/PLATELET
BASOS PCT: 0 %
Basophils Absolute: 0 10*3/uL (ref 0.0–0.1)
EOS ABS: 0.1 10*3/uL (ref 0.0–0.7)
Eosinophils Relative: 1 %
HEMATOCRIT: 23 % — AB (ref 36.0–46.0)
HEMOGLOBIN: 7.9 g/dL — AB (ref 12.0–15.0)
Lymphocytes Relative: 19 %
Lymphs Abs: 2 10*3/uL (ref 0.7–4.0)
MCH: 30.4 pg (ref 26.0–34.0)
MCHC: 34.3 g/dL (ref 30.0–36.0)
MCV: 88.5 fL (ref 78.0–100.0)
MONOS PCT: 3 %
Monocytes Absolute: 0.3 10*3/uL (ref 0.1–1.0)
NEUTROS PCT: 76 %
Neutro Abs: 8.1 10*3/uL — ABNORMAL HIGH (ref 1.7–7.7)
Platelets: 147 10*3/uL — ABNORMAL LOW (ref 150–400)
RBC: 2.6 MIL/uL — ABNORMAL LOW (ref 3.87–5.11)
RDW: 13.4 % (ref 11.5–15.5)
WBC: 10.6 10*3/uL — AB (ref 4.0–10.5)

## 2016-04-11 LAB — RPR: RPR Ser Ql: NONREACTIVE

## 2016-04-11 MED ORDER — LACTATED RINGERS IV BOLUS (SEPSIS)
500.0000 mL | Freq: Once | INTRAVENOUS | Status: AC
  Administered 2016-04-11: 500 mL via INTRAVENOUS

## 2016-04-11 NOTE — Anesthesia Postprocedure Evaluation (Addendum)
Anesthesia Post Note  Patient: Jennifer [name]  Procedure(s) Performed: Procedure(s) (LRB): CESAREAN SECTION (N/A)  Patient location during evaluation: Mother Baby Anesthesia Type: Epidural Level of consciousness: awake and alert and oriented Pain management: pain level controlled Vital Signs Assessment: post-procedure vital signs reviewed and stable Respiratory status: spontaneous breathing and nonlabored ventilation Cardiovascular status: stable Postop Assessment: no headache, no backache, patient able to bend at knees, no signs of nausea or vomiting and adequate PO intake Anesthetic complications: no        Last Vitals:  Vitals:   04/11/16 0145 04/11/16 0500  BP: (!) 92/53 (!) 102/59  Pulse: 77 91  Resp: 16   Temp: 37.1 C 36.9 C    Last Pain:  Vitals:   04/11/16 0504  TempSrc:   PainSc: 0-No pain   Pain Goal: Patients Stated Pain Goal: 6 (04/10/16 0031)               Madison HickmanGREGORY,SUZANNE

## 2016-04-11 NOTE — Lactation Note (Signed)
This note was copied from a baby's chart. Lactation Consultation Note  Patient Name: Girl Paulina Fusinna Mallinger ZOXWR'UToday's Date: 04/11/2016 Reason for consult: Follow-up assessment   With this mom of a [redacted] week gestation, SGA baby,now 23 hours old, and weight just over 5 pounds. With hand expression today, I was not able to express any colostrum. Mom has not been pumping consistently every 3 hours, and was advised to do so, if she wants to provide EBm for her baby. I advised mom to increase to 27 flanges, since she has very large nipples, and also asked her RN to bring her coconut oil to use with pumping, to decrease nipple soreness/friction. Mom is formula feeding now, due to baby's size, and pumping. Manual hand pump given to mom. Mom knows to call for questions/concerns   Maternal Data    Feeding    LATCH Score/Interventions    Audible Swallowing:  (no colostrum seen with hand expression)  Type of Nipple: Everted at rest and after stimulation (nipples large, and baby very small, mom pumping and bottle feeding for now)              Lactation Tools Discussed/Used Pump Review: Setup, frequency, and cleaning Date initiated:: 04/10/16   Consult Status Consult Status: Follow-up Date: 04/12/16 Follow-up type: In-patient    Alfred LevinsLee, Manus Weedman Anne 04/11/2016, 3:30 PM

## 2016-04-11 NOTE — Progress Notes (Signed)
Provider notified of decreased UOP- new orders given for CBC and fluids

## 2016-04-11 NOTE — Progress Notes (Signed)
Subjective: Postpartum Day 1: Cesarean Delivery Patient reports tolerating PO and + flatus.    Objective: Vital signs in last 24 hours: Temp:  [97.6 F (36.4 C)-98.7 F (37.1 C)] 98.4 F (36.9 C) (02/10 0931) Pulse Rate:  [71-116] 91 (02/10 0500) Resp:  [13-19] 16 (02/10 0145) BP: (82-132)/(50-85) 102/59 (02/10 0500) SpO2:  [94 %-100 %] 98 % (02/10 0931)  Physical Exam:  General: alert, cooperative, appears stated age and no distress Lochia: appropriate Uterine Fundus: firm Incision: healing well DVT Evaluation: No evidence of DVT seen on physical exam.   Recent Labs  04/09/16 2342 04/11/16 0415  HGB 12.3 7.9*  HCT 35.9* 23.0*    Assessment/Plan: Status post Cesarean section. Doing well postoperatively.  Continue current care.  Mikaili Flippin C 04/11/2016, 10:10 AM

## 2016-04-12 ENCOUNTER — Encounter (HOSPITAL_COMMUNITY): Payer: Self-pay | Admitting: Obstetrics and Gynecology

## 2016-04-12 MED ORDER — OXYCODONE-ACETAMINOPHEN 5-325 MG PO TABS
1.0000 | ORAL_TABLET | ORAL | Status: DC | PRN
  Administered 2016-04-12 – 2016-04-13 (×5): 1 via ORAL
  Filled 2016-04-12 (×5): qty 1

## 2016-04-12 NOTE — Lactation Note (Signed)
This note was copied from a baby's chart. Lactation Consultation Note  Baby 47 hours old  < 6lbs.  Mother states she has had difficulty latching. Mother has larger diameter nipples.  Reviewed hand expression bilaterally.  No drops expressed. Mother states she has been pumping q2-3 hours with no drops. Assisted w/ latching in cross cradle hold.  Baby latched briefly and fell asleep. Reviewed LPI feeding behavior. Encouraged mother to continue pumping w/ DEBP.  Add hand expression and massage to pumping. Discussed LPI volume guidelines and when to increase volumes per day of life. Answered questions and encouraged mother to continue to try at breast for at least 10 min and then supplement if she does not latch.  Patient Name: Jennifer [name] ZOXWR'UToday's Date: 04/12/2016 Reason for consult: Follow-up assessment   Maternal Data    Feeding Feeding Type: Breast Fed Length of feed: 2 min (mouthed nipple fell asleep)  LATCH Score/Interventions Latch: Repeated attempts needed to sustain latch, nipple held in mouth throughout feeding, stimulation needed to elicit sucking reflex.  Audible Swallowing: None  Type of Nipple: Everted at rest and after stimulation  Comfort (Breast/Nipple): Soft / non-tender     Hold (Positioning): Assistance needed to correctly position infant at breast and maintain latch.  LATCH Score: 6  Lactation Tools Discussed/Used     Consult Status Consult Status: Follow-up Date: 04/13/16 Follow-up type: In-patient    Dahlia ByesBerkelhammer, Mical Brun [facility] 04/12/2016, 3:44 PM

## 2016-04-12 NOTE — Progress Notes (Signed)
Subjective: Postpartum Day 2: Cesarean Delivery Patient reports tolerating PO, + flatus and no problems voiding.    Objective: Vital signs in last 24 hours: Temp:  [97.6 F (36.4 C)-98.2 F (36.8 C)] 97.7 F (36.5 C) (02/11 0627) Pulse Rate:  [80-83] 80 (02/11 0627) Resp:  [17-18] 17 (02/11 0627) BP: (99-113)/(55-69) 109/55 (02/11 0627) SpO2:  [100 %] 100 % (02/10 1330)  Physical Exam:  General: alert, cooperative, appears stated age and no distress Lochia: appropriate Uterine Fundus: firm Incision: healing well DVT Evaluation: No evidence of DVT seen on physical exam.   Recent Labs  04/09/16 2342 04/11/16 0415  HGB 12.3 7.9*  HCT 35.9* 23.0*    Assessment/Plan: Status post Cesarean section. Doing well postoperatively.  Continue current care.  Anay Rathe C 04/12/2016, 9:47 AM

## 2016-04-13 MED ORDER — OXYCODONE-ACETAMINOPHEN 5-325 MG PO TABS: 1.0000 | ORAL_TABLET | ORAL | 0 refills | Status: DC | PRN

## 2016-04-13 MED ORDER — IBUPROFEN 600 MG PO TABS: 600.0000 mg | ORAL_TABLET | Freq: Four times a day (QID) | ORAL | 1 refills | Status: DC | PRN

## 2016-04-13 NOTE — Lactation Note (Signed)
This note was copied from a baby's chart. Lactation Consultation Note  Patient Name: Jennifer [name] JXBJY'NToday's Date: 04/13/2016 Reason for consult: Follow-up assessment;Infant < 6lbs;Other (Comment) (PPH 1200 cc, IVF)   Follow up with mom of 66 hour old infant. Infant being held by FOB while mom pumping. Mom noted that she becomes nauseated with pumping and did so with her son. Gave her a cool cloth and FOB got her something to drink. She pumped 1-2 gtts from each breast and was noted in flange, enc parents to give to infant on finger. Mom reports she is pumping and is not getting any colostrum. Mom reports her milk came in in the hospital with her son but she did not lose as much blood with her son.   Enc mom to continue to feed infant at least every 3 hours and continue to increase supplement per LPT infant guidelines. Parents are obtaining Neosure 22 cal for home use. Mom has a pump at home to use. Parents report they are waiting on a weight at 12 noon to decide if infant can go home today.  Reviewed I/O, Engorgement prevention/treatment, and Breast milk pumping, handling and storage. Mom and dad without questions/concerns at this time. Reviewed LC Brochure, reviewed IP/OP services, BF Support Groups, and LC phone #. Offered OP LC appt, mom unsure at this time. Infant with follow up ped appt tomorrow. Enc mom to bring infant to OP Support Groups for weight checks. Enc parents to call with questions/concerns prn.      Maternal Data Formula Feeding for Exclusion: Yes Reason for exclusion: Mother's choice to formula and breast feed on admission Has patient been taught Hand Expression?: Yes Does the patient have breastfeeding experience prior to this delivery?: Yes  Feeding Feeding Type: Formula Nipple Type: Slow - flow  LATCH Score/Interventions                      Lactation Tools Discussed/Used WIC Program: No Pump Review: Setup, frequency, and cleaning;Milk  Storage Initiated by:: Reviewed   Consult Status Consult Status: Complete Follow-up type: Call as needed    Ed BlalockSharon S Hice 04/13/2016, 10:15 AM

## 2016-04-13 NOTE — Discharge Summary (Signed)
Obstetric Discharge Summary Reason for Admission: onset of labor Prenatal Procedures: none Intrapartum Procedures: cesarean: low cervical, transverse, failed TOLAC, R CS Postpartum Procedures: none Complications-Operative and Postpartum: none Hemoglobin  Date Value Ref Range Status  04/11/2016 7.9 (L) 12.0 - 15.0 g/dL Final    Comment:    REPEATED TO VERIFY DELTA CHECK NOTED    HCT  Date Value Ref Range Status  04/11/2016 23.0 (L) 36.0 - 46.0 % Final    Physical Exam:  General: alert Lochia: appropriate Uterine Fundus: firm Incision: healing well DVT Evaluation: No evidence of DVT seen on physical exam.  Discharge Diagnoses: Term Pregnancy-delivered, failed TOLAC, R CS  Discharge Information: Date: 04/13/2016 Activity: pelvic rest Diet: routine Medications: PNV, Ibuprofen, Iron and Percocet Condition: stable Instructions: refer to practice specific booklet Discharge to: home Follow-up Information    [facility] Schedule an appointment as soon as possible for a visit in 2 week(s).   Contact information: [address] [place] [id number] [phone]           Newborn Data: Live born female  Birth Weight: 5 lb 1.5 oz (2310 g) APGAR: 6, 9  Home with mother.  Jennifer [name] M 04/13/2016, 9:05 AM

## 2016-04-22 ENCOUNTER — Encounter (HOSPITAL_COMMUNITY): Admission: RE | Admit: 2016-04-22 | Payer: PRIVATE HEALTH INSURANCE | Source: Ambulatory Visit

## 2016-04-22 HISTORY — DX: Supervision of elderly multigravida, unspecified trimester: O09.529

## 2016-04-23 ENCOUNTER — Inpatient Hospital Stay (HOSPITAL_COMMUNITY)
Admission: RE | Admit: 2016-04-23 | Payer: PRIVATE HEALTH INSURANCE | Source: Ambulatory Visit | Admitting: Obstetrics and Gynecology

## 2016-04-23 ENCOUNTER — Encounter (HOSPITAL_COMMUNITY): Admission: RE | Payer: Self-pay | Source: Ambulatory Visit

## 2016-04-23 SURGERY — Surgical Case
Anesthesia: Regional

## 2016-09-14 NOTE — Addendum Note (Signed)
Addendum  created 09/14/16 1633 by Jairo BenJackson, Chalmers Iddings, MD   Delete clinical note, Sign clinical note

## 2016-09-14 NOTE — Addendum Note (Signed)
Addendum  created 09/14/16 1529 by Calais Svehla, MD   Sign clinical note    

## 2016-09-14 NOTE — Anesthesia Postprocedure Evaluation (Deleted)
Anesthesia Post Note  Patient: Jennifer [name]  Procedure(s) Performed: Procedure(s) (LRB): CESAREAN SECTION (N/A)     Anesthesia Post Evaluation  Last Vitals:  Vitals:   04/12/16 1819 04/13/16 0500  BP: 103/64 116/65  Pulse: 79 79  Resp: 16 16  Temp: 36.8 C 36.4 C    Last Pain:  Vitals:   04/13/16 0526  TempSrc:   PainSc: 3                  Tejasvi Brissett,E. Adalynn Corne

## 2021-07-21 ENCOUNTER — Other Ambulatory Visit: Payer: Self-pay | Admitting: Obstetrics and Gynecology

## 2021-07-21 DIAGNOSIS — R928 Other abnormal and inconclusive findings on diagnostic imaging of breast: Secondary | ICD-10-CM

## 2021-07-23 ENCOUNTER — Other Ambulatory Visit: Payer: Self-pay | Admitting: Obstetrics and Gynecology

## 2021-07-23 ENCOUNTER — Ambulatory Visit
Admission: RE | Admit: 2021-07-23 | Discharge: 2021-07-23 | Disposition: A | Discharge status: Home / Self Care | Payer: BC Managed Care – PPO | Source: Ambulatory Visit | Attending: Obstetrics and Gynecology | Admitting: Obstetrics and Gynecology

## 2021-07-23 DIAGNOSIS — R928 Other abnormal and inconclusive findings on diagnostic imaging of breast: Secondary | ICD-10-CM

## 2021-07-23 DIAGNOSIS — R921 Mammographic calcification found on diagnostic imaging of breast: Secondary | ICD-10-CM

## 2021-08-04 ENCOUNTER — Ambulatory Visit
Admission: RE | Admit: 2021-08-04 | Discharge: 2021-08-04 | Disposition: A | Discharge status: Home / Self Care | Payer: BC Managed Care – PPO | Source: Ambulatory Visit | Attending: Obstetrics and Gynecology | Admitting: Obstetrics and Gynecology

## 2021-08-04 DIAGNOSIS — R921 Mammographic calcification found on diagnostic imaging of breast: Secondary | ICD-10-CM

## 2021-08-19 ENCOUNTER — Ambulatory Visit: Payer: Self-pay | Admitting: Surgery

## 2021-08-19 DIAGNOSIS — N6489 Other specified disorders of breast: Secondary | ICD-10-CM

## 2021-08-19 NOTE — H&P (Signed)
Subjective   Chief Complaint: Breast Mass (Left breast radial scar/)     History of Present Illness: Jennifer [name] is a 41 y.o. female who is seen today as an office consultation at the request of Dr. Rana Snare for evaluation of Breast Mass (Left breast radial scar/) .   This is a 41 year old female in good health who presents after her second recent mammogram.  This detected 2 areas of calcifications in her left breast.  Both of these areas were biopsied under stereotactic guidance.  1 of these was benign breast tissue with fibrocystic changes.  The other showed a radial scar.  She is referred to [place] for excision of the area in the left upper outer quadrant with the radial scar.  No family history of breast cancer in first-degree relatives.  Both grandmothers had breast cancer.  No previous breast problems   Review of Systems: A complete review of systems was obtained from the patient.  I have reviewed this information and discussed as appropriate with the patient.  See HPI as well for other ROS.  Review of Systems  Constitutional: Negative.   HENT: Negative.    Eyes: Negative.   Respiratory: Negative.    Cardiovascular: Negative.   Gastrointestinal: Negative.   Genitourinary: Negative.   Musculoskeletal: Negative.   Skin: Negative.   Neurological: Negative.   Endo/Heme/Allergies: Negative.   Psychiatric/Behavioral: Negative.       Medical History: History reviewed. No pertinent past medical history.  Patient Active Problem List  Diagnosis   Radial scar of left breast    Past Surgical History:  Procedure Laterality Date   CESAREAN SECTION  05/07/2013   CESAREAN SECTION  04/10/2016     No Known Allergies  Current Outpatient Medications on File Prior to Visit  Medication Sig Dispense Refill   buPROPion (WELLBUTRIN XL) 300 MG XL tablet bupropion HCl XL 300 mg 24 hr tablet, extended release  TAKE 1 TABLET BY MOUTH EVERY DAY IN THE MORNING     drospirenone-ethinyl  estradioL (NIKKI, 28,) 3-0.02 mg tablet Nikki (28) 3 mg-0.02 mg tablet  TAKE 1 TABLET BY MOUTH EVERY DAY     No current facility-administered medications on file prior to visit.    History reviewed. No pertinent family history.   Social History   Tobacco Use  Smoking Status Former   Types: Cigarettes  Smokeless Tobacco Never     Social History   Socioeconomic History   Marital status: Married  Tobacco Use   Smoking status: Former    Types: Cigarettes   Smokeless tobacco: Never  Vaping Use   Vaping Use: Never used  Substance and Sexual Activity   Alcohol use: Yes   Drug use: Never    Objective:    Vitals:   08/19/21 1355  BP: 128/68  Pulse: 84  Temp: 36.9 C (98.4 F)  SpO2: 98%  Weight: 73.8 kg (162 lb 9.6 oz)  Height: 167.6 cm (5\' 6" )    Body mass index is 26.24 kg/m.  Physical Exam   Constitutional:  WDWN in NAD, conversant, no obvious deformities; lying in bed comfortably Eyes:  Pupils equal, round; sclera anicteric; moist conjunctiva; no lid lag HENT:  Oral mucosa moist; good dentition  Neck:  No masses palpated, trachea midline; no thyromegaly Lungs:  CTA bilaterally; normal respiratory effort Breasts:  symmetric, no nipple changes; bilateral fibrocystic changes, no palpable masses or lymphadenopathy on either side CV:  Regular rate and rhythm; no murmurs; extremities well-perfused with no edema Abd:  +  bowel sounds, soft, non-tender, no palpable organomegaly; no palpable hernias Musc:  Unable to assess gait; no apparent clubbing or cyanosis in extremities Lymphatic:  No palpable cervical or axillary lymphadenopathy Skin:  Warm, dry; no sign of jaundice Psychiatric - alert and oriented x 4; calm mood and affect   Labs, Imaging and Diagnostic Testing: Left upper outer quadrant (ribbon clip) - radial scar with usual ductal hyperplasia, columnar cell change, fibrocystic changes with abundant microcalcifications  Assessment and Plan:  Diagnoses and  all orders for this visit:  Radial scar of left breast     Left breast radioactive seed localized lumpectomy.The surgical procedure has been discussed with the patient.  Potential risks, benefits, alternative treatments, and expected outcomes have been explained.  All of the patient's questions at this time have been answered.  The likelihood of reaching the patient's treatment goal is good.  The patient understand the proposed surgical procedure and wishes to proceed.   Madisun Hargrove Delbert Harness, MD  08/19/2021 2:14 PM

## 2021-08-22 ENCOUNTER — Other Ambulatory Visit: Payer: Self-pay | Admitting: Surgery

## 2021-08-22 DIAGNOSIS — N6489 Other specified disorders of breast: Secondary | ICD-10-CM

## 2021-10-14 ENCOUNTER — Encounter (HOSPITAL_BASED_OUTPATIENT_CLINIC_OR_DEPARTMENT_OTHER): Payer: Self-pay | Admitting: Surgery

## 2021-10-14 ENCOUNTER — Other Ambulatory Visit: Payer: Self-pay

## 2021-10-20 ENCOUNTER — Ambulatory Visit
Admission: RE | Admit: 2021-10-20 | Discharge: 2021-10-20 | Disposition: A | Discharge status: Home / Self Care | Payer: BC Managed Care – PPO | Source: Ambulatory Visit | Attending: Surgery | Admitting: Surgery

## 2021-10-20 DIAGNOSIS — N6489 Other specified disorders of breast: Secondary | ICD-10-CM

## 2021-10-20 MED ORDER — CHLORHEXIDINE GLUCONATE CLOTH 2 % EX PADS: 6.0000 | MEDICATED_PAD | Freq: Once | CUTANEOUS | Status: DC

## 2021-10-20 NOTE — Progress Notes (Signed)
      Enhanced Recovery after Surgery Enhanced Recovery after Surgery is a protocol used to improve the stress on your body and your recovery after surgery.  Patient Instructions  The night before surgery:  No food after midnight. ONLY clear liquids after midnight  The day of surgery (if you do NOT have diabetes):  Drink ONE (1) Pre-Surgery Clear Ensure as directed.   This drink was given to you during your hospital  pre-op appointment visit. The pre-op nurse will instruct you on the time to drink the  Pre-Surgery Ensure depending on your surgery time. Finish the drink at the designated time by the pre-op nurse.  Nothing else to drink after completing the  Pre-Surgery Clear Ensure.  The day of surgery (if you have diabetes): Drink ONE (1) Gatorade 2 (G2) as directed. This drink was given to you during your hospital  pre-op appointment visit.  The pre-op nurse will instruct you on the time to drink the   Gatorade 2 (G2) depending on your surgery time. Color of the Gatorade may vary. Red is not allowed. Nothing else to drink after completing the  Gatorade 2 (G2).         If you have questions, please contact your surgeon's office.  Surgical soap given with written instructions, verbalized understandings 

## 2021-10-21 ENCOUNTER — Ambulatory Visit (HOSPITAL_BASED_OUTPATIENT_CLINIC_OR_DEPARTMENT_OTHER): Payer: BC Managed Care – PPO | Admitting: Anesthesiology

## 2021-10-21 ENCOUNTER — Ambulatory Visit
Admission: RE | Admit: 2021-10-21 | Discharge: 2021-10-21 | Disposition: A | Discharge status: Home / Self Care | Payer: BC Managed Care – PPO | Source: Ambulatory Visit | Attending: Surgery | Admitting: Surgery

## 2021-10-21 ENCOUNTER — Other Ambulatory Visit: Payer: Self-pay

## 2021-10-21 ENCOUNTER — Encounter (HOSPITAL_BASED_OUTPATIENT_CLINIC_OR_DEPARTMENT_OTHER)
Admission: RE | Disposition: A | Discharge status: Home / Self Care | Payer: Self-pay | Source: Home / Self Care | Attending: Surgery

## 2021-10-21 ENCOUNTER — Encounter (HOSPITAL_BASED_OUTPATIENT_CLINIC_OR_DEPARTMENT_OTHER): Payer: Self-pay | Admitting: Surgery

## 2021-10-21 ENCOUNTER — Ambulatory Visit (HOSPITAL_BASED_OUTPATIENT_CLINIC_OR_DEPARTMENT_OTHER)
Admission: RE | Admit: 2021-10-21 | Discharge: 2021-10-21 | Disposition: A | Discharge status: Home / Self Care | Payer: BC Managed Care – PPO | Attending: Surgery | Admitting: Surgery

## 2021-10-21 DIAGNOSIS — R921 Mammographic calcification found on diagnostic imaging of breast: Secondary | ICD-10-CM | POA: Insufficient documentation

## 2021-10-21 DIAGNOSIS — Z803 Family history of malignant neoplasm of breast: Secondary | ICD-10-CM | POA: Diagnosis not present

## 2021-10-21 DIAGNOSIS — Z87891 Personal history of nicotine dependence: Secondary | ICD-10-CM | POA: Diagnosis not present

## 2021-10-21 DIAGNOSIS — N6489 Other specified disorders of breast: Secondary | ICD-10-CM

## 2021-10-21 DIAGNOSIS — N6322 Unspecified lump in the left breast, upper inner quadrant: Secondary | ICD-10-CM | POA: Insufficient documentation

## 2021-10-21 DIAGNOSIS — Z01818 Encounter for other preprocedural examination: Secondary | ICD-10-CM

## 2021-10-21 HISTORY — PX: BREAST LUMPECTOMY WITH RADIOACTIVE SEED LOCALIZATION: SHX6424

## 2021-10-21 LAB — POCT PREGNANCY, URINE: Preg Test, Ur: NEGATIVE

## 2021-10-21 SURGERY — BREAST LUMPECTOMY WITH RADIOACTIVE SEED LOCALIZATION
Anesthesia: General | Site: Breast | Laterality: Left

## 2021-10-21 MED ORDER — FENTANYL CITRATE (PF) 100 MCG/2ML IJ SOLN
INTRAMUSCULAR | Status: AC
  Filled 2021-10-21: qty 2

## 2021-10-21 MED ORDER — DEXAMETHASONE SODIUM PHOSPHATE 10 MG/ML IJ SOLN
INTRAMUSCULAR | Status: DC | PRN
  Administered 2021-10-21: 8 mg via INTRAVENOUS

## 2021-10-21 MED ORDER — LACTATED RINGERS IV SOLN: INTRAVENOUS | Status: DC

## 2021-10-21 MED ORDER — LIDOCAINE HCL 1 % IJ SOLN
INTRAMUSCULAR | Status: DC | PRN
  Administered 2021-10-21: 50 mg via INTRADERMAL

## 2021-10-21 MED ORDER — FENTANYL CITRATE (PF) 100 MCG/2ML IJ SOLN
INTRAMUSCULAR | Status: DC | PRN
  Administered 2021-10-21 (×3): 25 ug via INTRAVENOUS
  Administered 2021-10-21: 50 ug via INTRAVENOUS
  Administered 2021-10-21 (×2): 25 ug via INTRAVENOUS

## 2021-10-21 MED ORDER — DEXAMETHASONE SODIUM PHOSPHATE 10 MG/ML IJ SOLN
INTRAMUSCULAR | Status: AC
  Filled 2021-10-21: qty 1

## 2021-10-21 MED ORDER — OXYCODONE HCL 5 MG PO TABS
ORAL_TABLET | ORAL | Status: AC
  Filled 2021-10-21: qty 1

## 2021-10-21 MED ORDER — BUPIVACAINE-EPINEPHRINE 0.25% -1:200000 IJ SOLN
INTRAMUSCULAR | Status: DC | PRN
  Administered 2021-10-21: 10 mL

## 2021-10-21 MED ORDER — OXYCODONE HCL 5 MG/5ML PO SOLN: 5.0000 mg | Freq: Once | ORAL | Status: AC | PRN

## 2021-10-21 MED ORDER — PROMETHAZINE HCL 25 MG/ML IJ SOLN: 6.2500 mg | INTRAMUSCULAR | Status: DC | PRN

## 2021-10-21 MED ORDER — FENTANYL CITRATE (PF) 100 MCG/2ML IJ SOLN: 25.0000 ug | INTRAMUSCULAR | Status: DC | PRN

## 2021-10-21 MED ORDER — MIDAZOLAM HCL 2 MG/2ML IJ SOLN
INTRAMUSCULAR | Status: AC
  Filled 2021-10-21: qty 2

## 2021-10-21 MED ORDER — DEXMEDETOMIDINE (PRECEDEX) IN NS 20 MCG/5ML (4 MCG/ML) IV SYRINGE
PREFILLED_SYRINGE | INTRAVENOUS | Status: DC | PRN
  Administered 2021-10-21 (×3): 4 ug via INTRAVENOUS

## 2021-10-21 MED ORDER — PROPOFOL 10 MG/ML IV BOLUS
INTRAVENOUS | Status: DC | PRN
  Administered 2021-10-21: 150 mg via INTRAVENOUS
  Administered 2021-10-21: 10 mg via INTRAVENOUS

## 2021-10-21 MED ORDER — PROPOFOL 10 MG/ML IV BOLUS
INTRAVENOUS | Status: AC
  Filled 2021-10-21: qty 20

## 2021-10-21 MED ORDER — ONDANSETRON HCL 4 MG/2ML IJ SOLN
INTRAMUSCULAR | Status: AC
  Filled 2021-10-21: qty 2

## 2021-10-21 MED ORDER — CEFAZOLIN SODIUM-DEXTROSE 2-4 GM/100ML-% IV SOLN
INTRAVENOUS | Status: AC
  Filled 2021-10-21: qty 100

## 2021-10-21 MED ORDER — ONDANSETRON HCL 4 MG/2ML IJ SOLN
INTRAMUSCULAR | Status: DC | PRN
  Administered 2021-10-21: 4 mg via INTRAVENOUS

## 2021-10-21 MED ORDER — LIDOCAINE 2% (20 MG/ML) 5 ML SYRINGE
INTRAMUSCULAR | Status: AC
  Filled 2021-10-21: qty 5

## 2021-10-21 MED ORDER — KETOROLAC TROMETHAMINE 30 MG/ML IJ SOLN
30.0000 mg | Freq: Once | INTRAMUSCULAR | Status: DC | PRN
Start: 2021-10-21 — End: 2021-10-21

## 2021-10-21 MED ORDER — OXYCODONE HCL 5 MG PO TABS
5.0000 mg | ORAL_TABLET | Freq: Once | ORAL | Status: AC | PRN
  Administered 2021-10-21: 5 mg via ORAL

## 2021-10-21 MED ORDER — ACETAMINOPHEN 500 MG PO TABS
1000.0000 mg | ORAL_TABLET | ORAL | Status: AC
  Administered 2021-10-21: 1000 mg via ORAL

## 2021-10-21 MED ORDER — BUPIVACAINE HCL (PF) 0.25 % IJ SOLN
INTRAMUSCULAR | Status: AC
  Filled 2021-10-21: qty 30

## 2021-10-21 MED ORDER — PROPOFOL 500 MG/50ML IV EMUL
INTRAVENOUS | Status: DC | PRN
  Administered 2021-10-21: 30 ug/kg/min via INTRAVENOUS

## 2021-10-21 MED ORDER — MIDAZOLAM HCL 5 MG/5ML IJ SOLN
INTRAMUSCULAR | Status: DC | PRN
  Administered 2021-10-21 (×2): 1 mg via INTRAVENOUS

## 2021-10-21 MED ORDER — CEFAZOLIN SODIUM-DEXTROSE 2-4 GM/100ML-% IV SOLN
2.0000 g | INTRAVENOUS | Status: AC
  Administered 2021-10-21: 2 g via INTRAVENOUS

## 2021-10-21 MED ORDER — DEXMEDETOMIDINE HCL IN NACL 80 MCG/20ML IV SOLN
INTRAVENOUS | Status: AC
  Filled 2021-10-21: qty 20

## 2021-10-21 MED ORDER — BUPIVACAINE-EPINEPHRINE (PF) 0.25% -1:200000 IJ SOLN
INTRAMUSCULAR | Status: AC
  Filled 2021-10-21: qty 30

## 2021-10-21 MED ORDER — ACETAMINOPHEN 500 MG PO TABS
ORAL_TABLET | ORAL | Status: AC
  Filled 2021-10-21: qty 2

## 2021-10-21 MED ORDER — AMISULPRIDE (ANTIEMETIC) 5 MG/2ML IV SOLN: 10.0000 mg | Freq: Once | INTRAVENOUS | Status: DC | PRN

## 2021-10-21 SURGICAL SUPPLY — 50 items
APL PRP STRL LF DISP 70% ISPRP (MISCELLANEOUS) ×1
APL SKNCLS STERI-STRIP NONHPOA (GAUZE/BANDAGES/DRESSINGS) ×1
APPLIER CLIP 9.375 MED OPEN (MISCELLANEOUS) ×1
APR CLP MED 9.3 20 MLT OPN (MISCELLANEOUS) ×1
BENZOIN TINCTURE PRP APPL 2/3 (GAUZE/BANDAGES/DRESSINGS) ×1 IMPLANT
BLADE HEX COATED 2.75 (ELECTRODE) ×1 IMPLANT
BLADE SURG 15 STRL LF DISP TIS (BLADE) ×1 IMPLANT
BLADE SURG 15 STRL SS (BLADE) ×1
CANISTER SUC SOCK COL 7IN (MISCELLANEOUS) IMPLANT
CANISTER SUCT 1200ML W/VALVE (MISCELLANEOUS) IMPLANT
CHLORAPREP W/TINT 26 (MISCELLANEOUS) ×1 IMPLANT
CLIP APPLIE 9.375 MED OPEN (MISCELLANEOUS) ×1 IMPLANT
COVER BACK TABLE 60X90IN (DRAPES) ×1 IMPLANT
COVER MAYO STAND STRL (DRAPES) ×1 IMPLANT
COVER PROBE W GEL 5X96 (DRAPES) ×1 IMPLANT
DRAPE LAPAROTOMY 100X72 PEDS (DRAPES) ×1 IMPLANT
DRAPE UTILITY XL STRL (DRAPES) ×1 IMPLANT
DRSG TEGADERM 4X4.75 (GAUZE/BANDAGES/DRESSINGS) ×1 IMPLANT
ELECT REM PT RETURN 9FT ADLT (ELECTROSURGICAL) ×1
ELECTRODE REM PT RTRN 9FT ADLT (ELECTROSURGICAL) ×1 IMPLANT
GAUZE SPONGE 4X4 12PLY STRL LF (GAUZE/BANDAGES/DRESSINGS) IMPLANT
GLOVE BIO SURGEON STRL SZ7 (GLOVE) ×1 IMPLANT
GLOVE BIOGEL PI IND STRL 7.5 (GLOVE) ×1 IMPLANT
GLOVE BIOGEL PI INDICATOR 7.5 (GLOVE) ×1
GOWN STRL REUS W/ TWL LRG LVL3 (GOWN DISPOSABLE) ×2 IMPLANT
GOWN STRL REUS W/TWL LRG LVL3 (GOWN DISPOSABLE) ×2
ILLUMINATOR WAVEGUIDE N/F (MISCELLANEOUS) IMPLANT
KIT MARKER MARGIN INK (KITS) ×1 IMPLANT
LIGHT WAVEGUIDE WIDE FLAT (MISCELLANEOUS) IMPLANT
NDL HYPO 25X1 1.5 SAFETY (NEEDLE) ×1 IMPLANT
NEEDLE HYPO 25X1 1.5 SAFETY (NEEDLE) ×1 IMPLANT
NS IRRIG 1000ML POUR BTL (IV SOLUTION) ×1 IMPLANT
PACK BASIN DAY SURGERY FS (CUSTOM PROCEDURE TRAY) ×1 IMPLANT
PENCIL SMOKE EVACUATOR (MISCELLANEOUS) ×1 IMPLANT
SLEEVE SCD COMPRESS KNEE MED (STOCKING) ×1 IMPLANT
SPIKE FLUID TRANSFER (MISCELLANEOUS) IMPLANT
SPONGE GAUZE 2X2 8PLY STRL LF (GAUZE/BANDAGES/DRESSINGS) IMPLANT
SPONGE T-LAP 18X18 ~~LOC~~+RFID (SPONGE) IMPLANT
SPONGE T-LAP 4X18 ~~LOC~~+RFID (SPONGE) ×1 IMPLANT
STRIP CLOSURE SKIN 1/2X4 (GAUZE/BANDAGES/DRESSINGS) ×1 IMPLANT
SUT MON AB 4-0 PC3 18 (SUTURE) ×1 IMPLANT
SUT SILK 2 0 SH (SUTURE) IMPLANT
SUT VIC AB 3-0 SH 27 (SUTURE) ×1
SUT VIC AB 3-0 SH 27X BRD (SUTURE) ×1 IMPLANT
SYR BULB EAR ULCER 3OZ GRN STR (SYRINGE) IMPLANT
SYR CONTROL 10ML LL (SYRINGE) ×1 IMPLANT
TOWEL GREEN STERILE FF (TOWEL DISPOSABLE) ×1 IMPLANT
TRAY FAXITRON CT DISP (TRAY / TRAY PROCEDURE) ×1 IMPLANT
TUBE CONNECTING 20X1/4 (TUBING) IMPLANT
YANKAUER SUCT BULB TIP NO VENT (SUCTIONS) IMPLANT

## 2021-10-21 NOTE — Transfer of Care (Signed)
Immediate Anesthesia Transfer of Care Note  Patient: Jennifer [name]  Procedure(s) Performed: LEFT BREAST LUMPECTOMY WITH RADIOACTIVE SEED LOCALIZATION (Left: Breast)  Patient Location: PACU  Anesthesia Type:General  Level of Consciousness: awake, alert , oriented and patient cooperative  Airway & Oxygen Therapy: Patient Spontanous Breathing and Patient connected to face mask oxygen  Post-op Assessment: Report given to RN and Post -op Vital signs reviewed and stable  Post vital signs: Reviewed and stable  Last Vitals:  Vitals Value Taken Time  BP 116/68 10/21/21 0834  Temp    Pulse 69 10/21/21 0838  Resp 15 10/21/21 0838  SpO2 100 % 10/21/21 0838  Vitals shown include unvalidated device data.  Last Pain:  Vitals:   10/21/21 0647  TempSrc: Oral  PainSc: 0-No pain      Patients Stated Pain Goal: 4 (10/21/21 0786)  Complications: No notable events documented.

## 2021-10-21 NOTE — H&P (Signed)
Subjective    Chief Complaint: Breast Mass (Left breast radial scar/)       History of Present Illness: Jennifer [name] is a 41 y.o. female who is seen today as an office consultation at the request of Dr. Rana Snare for evaluation of Breast Mass (Left breast radial scar/) .   This is a 41 year old female in good health who presents after her second recent mammogram.  This detected 2 areas of calcifications in her left breast.  Both of these areas were biopsied under stereotactic guidance.  1 of these was benign breast tissue with fibrocystic changes.  The other showed a radial scar.  She is referred to [place] for excision of the area in the left upper outer quadrant with the radial scar.   No family history of breast cancer in first-degree relatives.  Both grandmothers had breast cancer.  No previous breast problems     Review of Systems: A complete review of systems was obtained from the patient.  I have reviewed this information and discussed as appropriate with the patient.  See HPI as well for other ROS.   Review of Systems  Constitutional: Negative.   HENT: Negative.    Eyes: Negative.   Respiratory: Negative.    Cardiovascular: Negative.   Gastrointestinal: Negative.   Genitourinary: Negative.   Musculoskeletal: Negative.   Skin: Negative.   Neurological: Negative.   Endo/Heme/Allergies: Negative.   Psychiatric/Behavioral: Negative.         Medical History: History reviewed. No pertinent past medical history.      Patient Active Problem List  Diagnosis   Radial scar of left breast           Past Surgical History:  Procedure Laterality Date   CESAREAN SECTION   05/07/2013   CESAREAN SECTION   04/10/2016      No Known Allergies         Current Outpatient Medications on File Prior to Visit  Medication Sig Dispense Refill   buPROPion (WELLBUTRIN XL) 300 MG XL tablet bupropion HCl XL 300 mg 24 hr tablet, extended release  TAKE 1 TABLET BY MOUTH EVERY DAY IN THE MORNING        drospirenone-ethinyl estradioL (NIKKI, 28,) 3-0.02 mg tablet Nikki (28) 3 mg-0.02 mg tablet  TAKE 1 TABLET BY MOUTH EVERY DAY        No current facility-administered medications on file prior to visit.      History reviewed. No pertinent family history.    Social History        Tobacco Use  Smoking Status Former   Types: Cigarettes  Smokeless Tobacco Never      Social History         Socioeconomic History   Marital status: Married  Tobacco Use   Smoking status: Former      Types: Cigarettes   Smokeless tobacco: Never  Building services engineer Use: Never used  Substance and Sexual Activity   Alcohol use: Yes   Drug use: Never      Objective:         Vitals:     BP: 128/68  Pulse: 84  Temp: 36.9 C (98.4 F)  SpO2: 98%  Weight: 73.8 kg (162 lb 9.6 oz)  Height: 167.6 cm (5\' 6" )    Body mass index is 26.24 kg/m.   Physical Exam    Constitutional:  WDWN in NAD, conversant, no obvious deformities; lying in bed comfortably Eyes:  Pupils equal, round; sclera anicteric; moist  conjunctiva; no lid lag HENT:  Oral mucosa moist; good dentition  Neck:  No masses palpated, trachea midline; no thyromegaly Lungs:  CTA bilaterally; normal respiratory effort Breasts:  symmetric, no nipple changes; bilateral fibrocystic changes, no palpable masses or lymphadenopathy on either side CV:  Regular rate and rhythm; no murmurs; extremities well-perfused with no edema Abd:  +bowel sounds, soft, non-tender, no palpable organomegaly; no palpable hernias Musc:  Unable to assess gait; no apparent clubbing or cyanosis in extremities Lymphatic:  No palpable cervical or axillary lymphadenopathy Skin:  Warm, dry; no sign of jaundice Psychiatric - alert and oriented x 4; calm mood and affect     Labs, Imaging and Diagnostic Testing: Left upper outer quadrant (ribbon clip) - radial scar with usual ductal hyperplasia, columnar cell change, fibrocystic changes with abundant  microcalcifications   Assessment and Plan:  Diagnoses and all orders for this visit:   Radial scar of left breast       Left breast radioactive seed localized lumpectomy.The surgical procedure has been discussed with the patient.  Potential risks, benefits, alternative treatments, and expected outcomes have been explained.  All of the patient's questions at this time have been answered.  The likelihood of reaching the patient's treatment goal is good.  The patient understand the proposed surgical procedure and wishes to proceed.    Wilmon Arms. Corliss Skains, MD, [facility]  General Surgery   10/21/2021 7:20 AM

## 2021-10-21 NOTE — Discharge Instructions (Addendum)
[facility] Office Phone Number [phone]  BREAST BIOPSY/ PARTIAL MASTECTOMY: POST OP INSTRUCTIONS  Always review your discharge instruction sheet given to you by the facility where your surgery was performed.  IF YOU HAVE DISABILITY OR FAMILY LEAVE FORMS, YOU MUST BRING THEM TO THE OFFICE FOR PROCESSING.  DO NOT GIVE THEM TO YOUR DOCTOR.  A prescription for pain medication may be given to you upon discharge.  Take your pain medication as prescribed, if needed.  If narcotic pain medicine is not needed, then you may take acetaminophen (Tylenol) or ibuprofen (Advil) as needed. Take your usually prescribed medications unless otherwise directed If you need a refill on your pain medication, please contact your pharmacy.  They will contact our office to request authorization.  Prescriptions will not be filled after 5pm or on week-ends. You should eat very light the first 24 hours after surgery, such as soup, crackers, pudding, etc.  Resume your normal diet the day after surgery. Most patients will experience some swelling and bruising in the breast.  Ice packs and a good support bra will help.  Swelling and bruising can take several days to resolve.  It is common to experience some constipation if taking pain medication after surgery.  Increasing fluid intake and taking a stool softener will usually help or prevent this problem from occurring.  A mild laxative (Milk of Magnesia or Miralax) should be taken according to package directions if there are no bowel movements after 48 hours. Unless discharge instructions indicate otherwise, you may remove your bandages 24-48 hours after surgery, and you may shower at that time.  You may have steri-strips (small skin tapes) in place directly over the incision.  These strips should be left on the skin for 7-10 days.  If your surgeon used skin glue on the incision, you may shower in 24 hours.  The glue will flake off over the next 2-3 weeks.  Any  sutures or staples will be removed at the office during your follow-up visit. ACTIVITIES:  You may resume regular daily activities (gradually increasing) beginning the next day.  Wearing a good support bra or sports bra minimizes pain and swelling.  You may have sexual intercourse when it is comfortable. You may drive when you no longer are taking prescription pain medication, you can comfortably wear a seatbelt, and you can safely maneuver your car and apply brakes. RETURN TO WORK:  ______________________________________________________________________________________ Jennifer [name] should see your doctor in the office for a follow-up appointment approximately two weeks after your surgery.  Your doctor's nurse will typically make your follow-up appointment when she calls you with your pathology report.  Expect your pathology report 2-3 business days after your surgery.  You may call to check if you do not hear from [place] after three days. OTHER INSTRUCTIONS: _______________________________________________________________________________________________ _____________________________________________________________________________________________________________________________________ _____________________________________________________________________________________________________________________________________ _____________________________________________________________________________________________________________________________________  WHEN TO CALL YOUR DOCTOR: Fever over 101.0 Nausea and/or vomiting. Extreme swelling or bruising. Continued bleeding from incision. Increased pain, redness, or drainage from the incision.  The clinic staff is available to answer your questions during regular business hours.  Please don't hesitate to call and ask to speak to one of the nurses for clinical concerns.  If you have a medical emergency, go to the nearest emergency room or call 911.  A surgeon from [facility] is always on call at the hospital.  For further questions, please visit [email or web address]      Post Anesthesia Home Care Instructions  Activity: Get plenty of rest for the  remainder of the day. A responsible individual must stay with you for 24 hours following the procedure.  For the next 24 hours, DO NOT: -Drive a car -Advertising copywriter -Drink alcoholic beverages -Take any medication unless instructed by your physician -Make any legal decisions or sign important papers.  Meals: Start with liquid foods such as gelatin or soup. Progress to regular foods as tolerated. Avoid greasy, spicy, heavy foods. If nausea and/or vomiting occur, drink only clear liquids until the nausea and/or vomiting subsides. Call your physician if vomiting continues.  Special Instructions/Symptoms: Your throat may feel dry or sore from the anesthesia or the breathing tube placed in your throat during surgery. If this causes discomfort, gargle with warm salt water. The discomfort should disappear within 24 hours.  If you had a scopolamine patch placed behind your ear for the management of post- operative nausea and/or vomiting:  1. The medication in the patch is effective for 72 hours, after which it should be removed.  Wrap patch in a tissue and discard in the trash. Wash hands thoroughly with soap and water. 2. You may remove the patch earlier than 72 hours if you experience unpleasant side effects which may include dry mouth, dizziness or visual disturbances. 3. Avoid touching the patch. Wash your hands with soap and water after contact with the patch.        Next Tylenol dose due at 3pm

## 2021-10-21 NOTE — Op Note (Signed)
Pre-op Diagnosis:  Left breast radial scar Post-op Diagnosis: same Procedure:  Left radioactive seed localized lumpectomy Surgeon:  Leotha Voeltz K. Anesthesia:  GEN - LMA Indications:  This is a 41 year old female in good health who presents after her second recent mammogram.  This detected 2 areas of calcifications in her left breast.  Both of these areas were biopsied under stereotactic guidance.  1 of these was benign breast tissue with fibrocystic changes.  The other showed a radial scar.  She is referred to [place] for excision of the area in the left upper outer quadrant with the radial scar.   No family history of breast cancer in first-degree relatives.  Both grandmothers had breast cancer.  No previous breast problems  Description of procedure: The patient is brought to the operating room placed in supine position on the operating room table. After an adequate level of general anesthesia was obtained, her left breast was prepped with ChloraPrep and draped in sterile fashion. A timeout was taken to ensure the proper patient and proper procedure. We interrogated the breast with the neoprobe. We made a circumareolar incision around the lateral side of the nipple after infiltrating with 0.25% Marcaine. Dissection was carried down in the breast tissue with cautery. We used the neoprobe to guide [place] towards the radioactive seed located laterally. We excised an area of tissue around the radioactive seed 2 cm in diameter. The specimen was removed and was oriented with a paint kit. Specimen mammogram showed the radioactive seed as well as the biopsy clip within the specimen. This was sent for pathologic examination. There is no residual radioactivity within the biopsy cavity. We inspected carefully for hemostasis. The wound was thoroughly irrigated. The wound was closed with a deep layer of 3-0 Vicryl and a subcuticular layer of 4-0 Monocryl. Benzoin Steri-Strips were applied. The patient was then extubated and  brought to the recovery room in stable condition. All sponge, instrument, and needle counts are correct.  Imogene Burn. Georgette Dover, MD, [facility]  General/ Trauma Surgery  10/21/2021 8:28 AM

## 2021-10-21 NOTE — Anesthesia Preprocedure Evaluation (Signed)
Anesthesia Evaluation  Patient identified by MRN, date of birth, ID band Patient awake    Reviewed: Allergy & Precautions, NPO status , Patient's Chart, lab work & pertinent test results  Airway Mallampati: II  TM Distance: >3 FB Neck ROM: Full    Dental no notable dental hx.    Pulmonary former smoker,    Pulmonary exam normal        Cardiovascular negative cardio ROS Normal cardiovascular exam     Neuro/Psych PSYCHIATRIC DISORDERS negative neurological ROS     GI/Hepatic negative GI ROS, Neg liver ROS,   Endo/Other  negative endocrine ROS  Renal/GU negative Renal ROS     Musculoskeletal negative musculoskeletal ROS (+)   Abdominal   Peds  Hematology negative hematology ROS (+)   Anesthesia Other Findings LEFT BREAST RADIAL SCAR  Reproductive/Obstetrics hcg negative                             Anesthesia Physical Anesthesia Plan  ASA: 2  Anesthesia Plan: General   Post-op Pain Management:    Induction: Intravenous  PONV Risk Score and Plan: 3 and Ondansetron, Dexamethasone, Midazolam and Treatment may vary due to age or medical condition  Airway Management Planned: LMA  Additional Equipment:   Intra-op Plan:   Post-operative Plan: Extubation in OR  Informed Consent: I have reviewed the patients History and Physical, chart, labs and discussed the procedure including the risks, benefits and alternatives for the proposed anesthesia with the patient or authorized representative who has indicated his/her understanding and acceptance.     Dental advisory given  Plan Discussed with: CRNA  Anesthesia Plan Comments:         Anesthesia Quick Evaluation

## 2021-10-21 NOTE — Anesthesia Postprocedure Evaluation (Signed)
Anesthesia Post Note  Patient: DEMITRIA HAY  Procedure(s) Performed: LEFT BREAST LUMPECTOMY WITH RADIOACTIVE SEED LOCALIZATION (Left: Breast)     Patient location during evaluation: PACU Anesthesia Type: General Level of consciousness: awake Pain management: pain level controlled Vital Signs Assessment: post-procedure vital signs reviewed and stable Respiratory status: spontaneous breathing, nonlabored ventilation, respiratory function stable and patient connected to nasal cannula oxygen Cardiovascular status: blood pressure returned to baseline and stable Postop Assessment: no apparent nausea or vomiting Anesthetic complications: no   No notable events documented.  Last Vitals:  Vitals:   10/21/21 0920 10/21/21 0931  BP:  127/74  Pulse: 71 (!) 58  Resp: (!) 21 15  Temp:  36.7 C  SpO2: 98% 99%    Last Pain:  Vitals:   10/21/21 0931  TempSrc: Oral  PainSc:                  Catheryn Bacon Fraser Busche

## 2021-10-21 NOTE — Anesthesia Procedure Notes (Signed)
Procedure Name: LMA Insertion Date/Time: 10/21/2021 7:36 AM  Performed by: Garth Bigness, CRNAPre-anesthesia Checklist: Patient identified, Emergency Drugs available, Suction available and Patient being monitored Patient Re-evaluated:Patient Re-evaluated prior to induction Oxygen Delivery Method: Circle system utilized Preoxygenation: Pre-oxygenation with 100% oxygen Induction Type: IV induction Ventilation: Mask ventilation without difficulty LMA: LMA inserted LMA Size: 4.0 Tube type: Oral Number of attempts: 1 Placement Confirmation: positive ETCO2 and breath sounds checked- equal and bilateral Tube secured with: Tape Dental Injury: Teeth and Oropharynx as per pre-operative assessment

## 2021-10-22 ENCOUNTER — Encounter (HOSPITAL_BASED_OUTPATIENT_CLINIC_OR_DEPARTMENT_OTHER): Payer: Self-pay | Admitting: Surgery

## 2021-10-22 LAB — SURGICAL PATHOLOGY

## 2021-11-05 ENCOUNTER — Encounter (HOSPITAL_COMMUNITY): Payer: Self-pay

## 2023-08-16 ENCOUNTER — Other Ambulatory Visit: Payer: Self-pay | Admitting: Obstetrics and Gynecology

## 2023-08-16 DIAGNOSIS — R928 Other abnormal and inconclusive findings on diagnostic imaging of breast: Secondary | ICD-10-CM

## 2023-08-24 ENCOUNTER — Other Ambulatory Visit: Payer: Self-pay | Admitting: Obstetrics and Gynecology

## 2023-08-24 ENCOUNTER — Ambulatory Visit
Admission: RE | Admit: 2023-08-24 | Discharge: 2023-08-24 | Disposition: A | Discharge status: Home / Self Care | Source: Ambulatory Visit | Attending: Obstetrics and Gynecology | Admitting: Obstetrics and Gynecology

## 2023-08-24 DIAGNOSIS — R921 Mammographic calcification found on diagnostic imaging of breast: Secondary | ICD-10-CM

## 2023-08-24 DIAGNOSIS — R928 Other abnormal and inconclusive findings on diagnostic imaging of breast: Secondary | ICD-10-CM

## 2024-02-29 ENCOUNTER — Encounter

## 2024-03-08 ENCOUNTER — Ambulatory Visit
Admission: RE | Admit: 2024-03-08 | Discharge: 2024-03-08 | Disposition: A | Discharge status: Home / Self Care | Source: Ambulatory Visit | Attending: Obstetrics and Gynecology | Admitting: Obstetrics and Gynecology

## 2024-03-08 ENCOUNTER — Other Ambulatory Visit: Payer: Self-pay | Admitting: Obstetrics and Gynecology

## 2024-03-08 DIAGNOSIS — R921 Mammographic calcification found on diagnostic imaging of breast: Secondary | ICD-10-CM

## 2024-03-08 DIAGNOSIS — R928 Other abnormal and inconclusive findings on diagnostic imaging of breast: Secondary | ICD-10-CM

## 2024-03-30 IMAGING — MG MM BREAST BX W LOC DEV 1ST LESION IMAGE BX SPEC STEREO GUIDE*L*
7 of 16 series · 7 of 36 positions shown · non-contrast
Comparison: None Available.
COMPARISON: None Available.

Addendum:
CLINICAL DATA: Indeterminate left breast calcifications.

EXAM:
LEFT BREAST STEREOTACTIC CORE NEEDLE BIOPSIES

[L (1 of 6)]
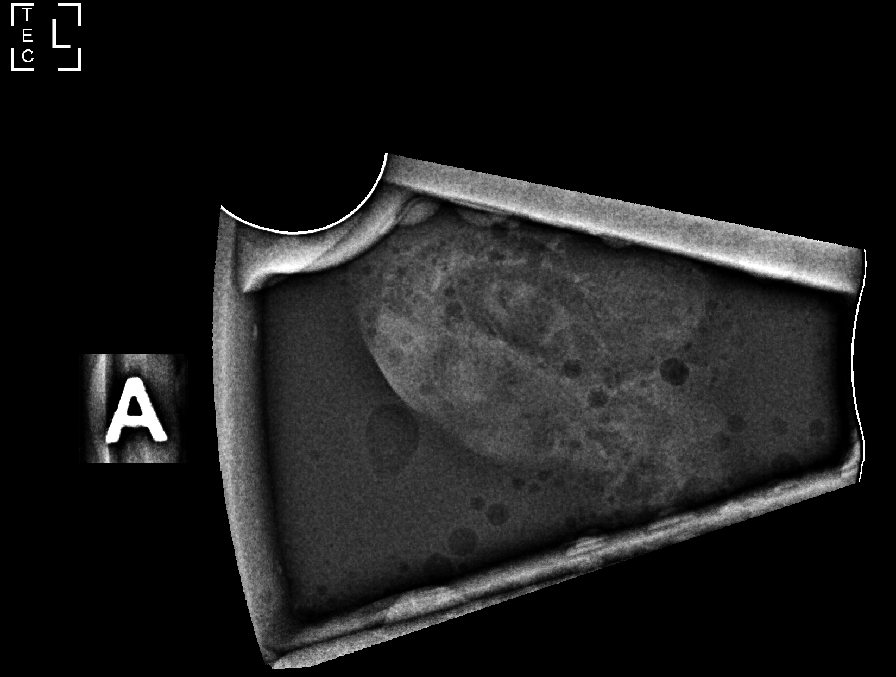

[L (2 of 6)]
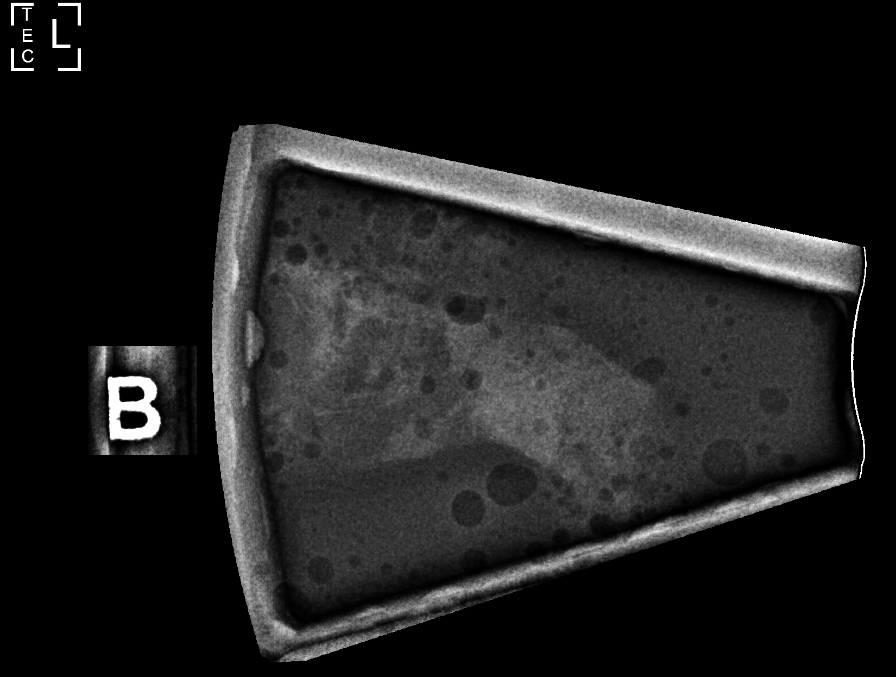

[L (3 of 6)]
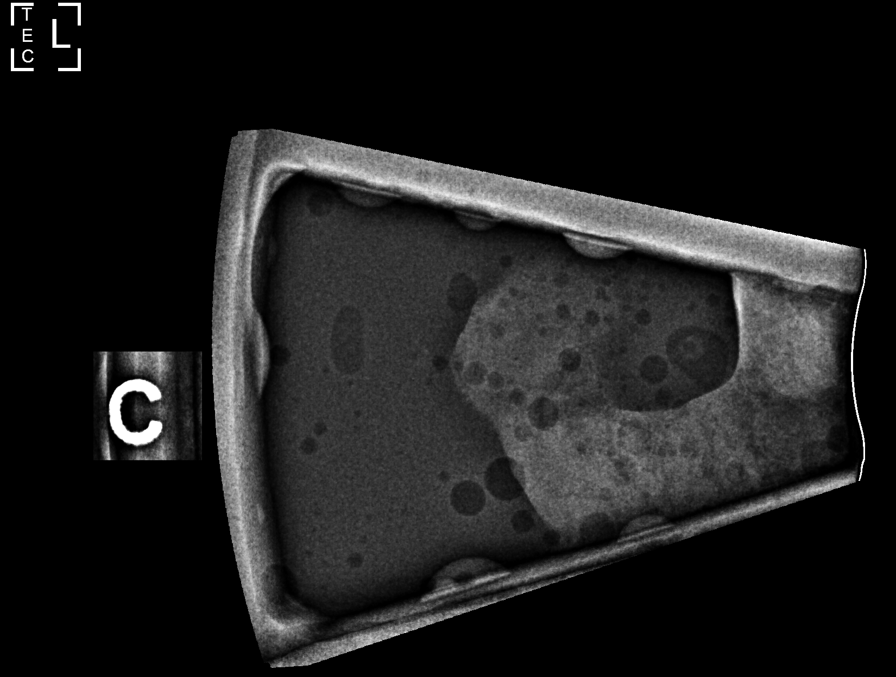

[L (4 of 6)]
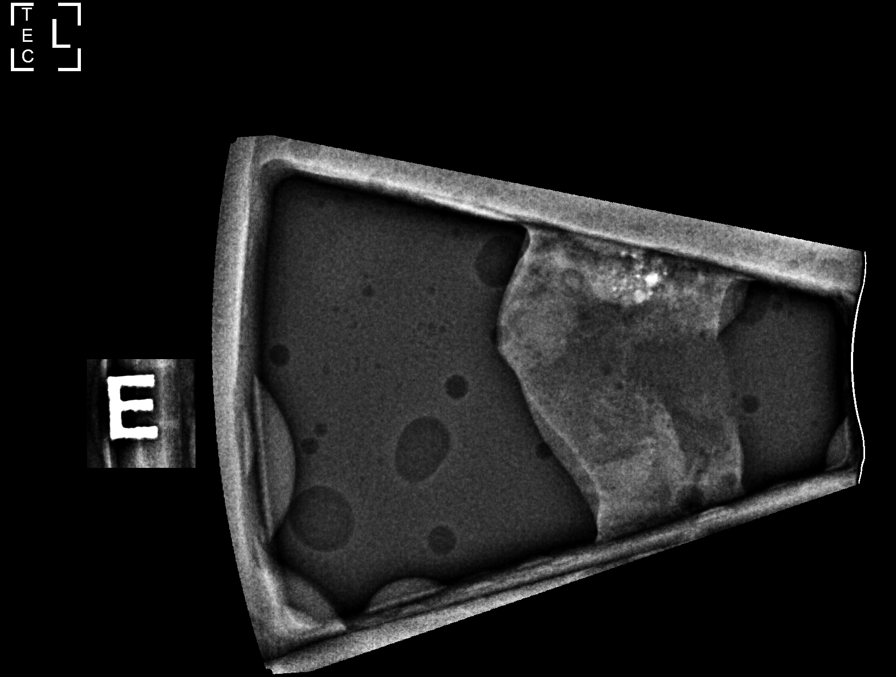

[L (5 of 6)]
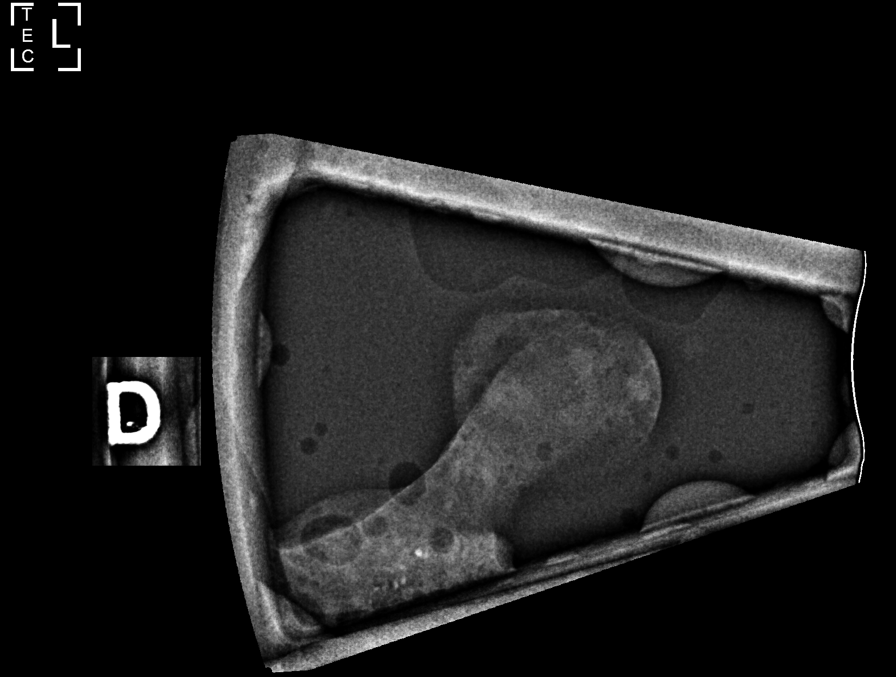

[L (6 of 6)]
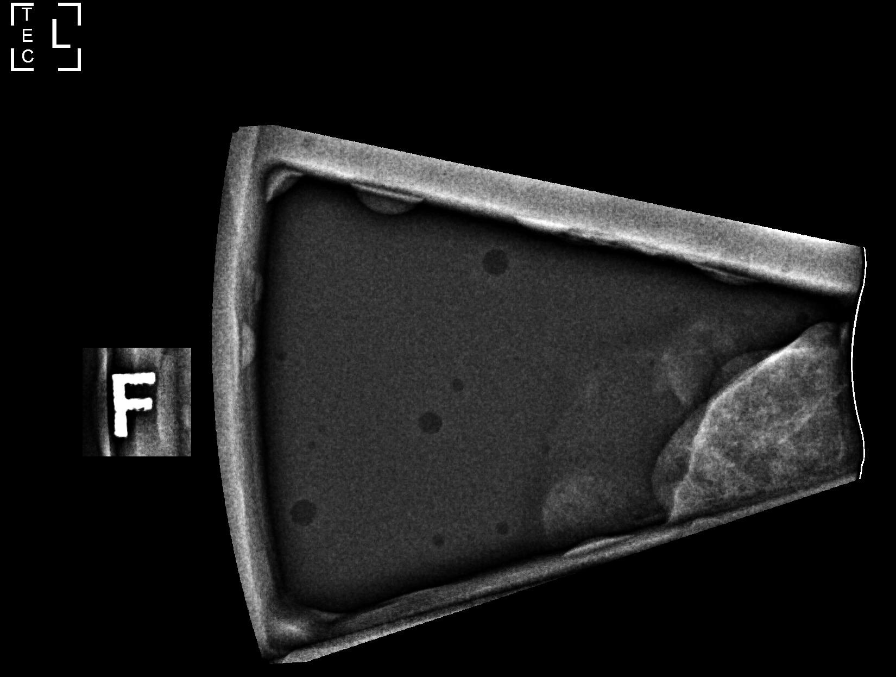

[L LM]
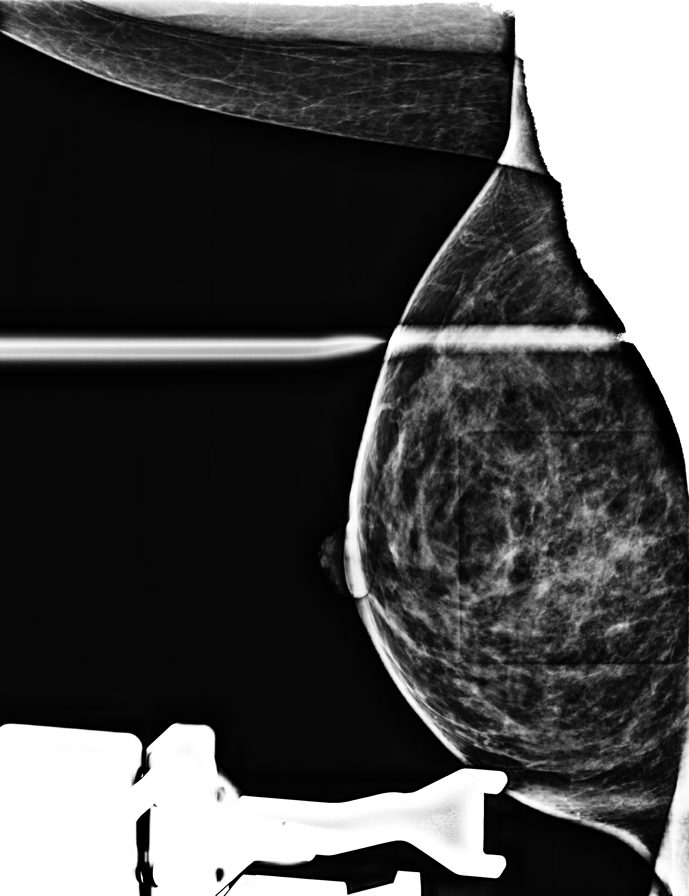

[7 of 36 positions shown; findings below may reference images not displayed]



Using sterile technique and 1% lidocaine and 1% lidocaine with
epinephrine as local anesthetic, under stereotactic guidance, a 9
gauge vacuum assisted device was used to perform core needle biopsy
of calcifications in the upper-outer quadrant of the left breast
(posterior) using a lateral to medial approach. Specimen radiograph
was performed showing calcifications are present in the tissue
samples. Specimens with calcifications are identified for pathology.

Lesion quadrant: Upper-outer quadrant (posterior)

At the conclusion of the procedure, ribbon shaped tissue marker clip
was deployed into the biopsy cavity. Follow-up 2-view mammogram was
performed and dictated separately.

The patient and I discussed the procedure of stereotactic-guided
biopsy including benefits and alternatives. We discussed the high
likelihood of a successful procedure. We discussed the risks of the
procedure including infection, bleeding, tissue injury, clip
migration, and inadequate sampling. Informed written consent was
given. The usual time out protocol was performed immediately prior
to the procedure.

Using sterile technique and 1% lidocaine and 1% lidocaine with
epinephrine as local anesthetic, under stereotactic guidance, a 9
gauge vacuum assisted device was used to perform core needle biopsy
of calcifications in the upper-outer quadrant of the left breast
(anterior) using a lateral to medial approach. Specimen radiograph
was performed showing calcifications are difficult to see in the
specimen radiographs. A few faint calcifications may be present.
Correlation with pathology is recommended. Specimens with
calcifications are identified for pathology.

Lesion quadrant: Upper-outer quadrant (anterior)

At the conclusion of the procedure, coil shaped tissue marker clip
was deployed into the biopsy cavity. Follow-up 2-view mammogram was
performed and dictated separately.
IMPRESSION: Stereotactic-guided biopsies of the left breast. No apparent
complications.

ADDENDUM:
Pathology revealed Breast, left, needle core biopsy, upper outer
quadrant (ribbon clip)- RADIAL SCAR WITH USUAL DUCTAL HYPERPLASIA,
COLUMNAR CELL CHANGE AND FIBROCYSTIC CHANGE AND ASSOCIATED ABUNDANT
MICROCALCIFICATION- BACKGROUND BREAST WITH FIBROCYSTIC CHANGE AND
RARE SCATTERED MICROCALCIFICATION. This was found to be concordant
by Dr. Silviano Lockard, with surgical consultation for excision
recommended.

Pathology revealed breast, left, needle core biopsy, upper outer
quadrant (coil clip)- BENIGN BREAST TISSUE WITH FIBROCYSTIC CHANGE,
MILD ADENOSIS AND SCATTERED MICROCALCIFICATIONS. This was found to
be concordant by Dr. Silviano Lockard.

Pathology results were discussed with the patient by telephone. The
patient reported doing well after the biopsies with tenderness at
the sites. Post biopsy instructions and care were reviewed and
questions were answered. The patient was encouraged to call The

Surgical consultation has been arranged with Dr. Mevlan Sharon at
[[email or web address]] on August 19, 2021. Recommend 6 month follow
up additional calcifications.

Pathology results reported by Dalianne Bracht RN on 08/06/2021.



Using sterile technique and 1% lidocaine and 1% lidocaine with
epinephrine as local anesthetic, under stereotactic guidance, a 9
gauge vacuum assisted device was used to perform core needle biopsy
of calcifications in the upper-outer quadrant of the left breast
(posterior) using a lateral to medial approach. Specimen radiograph
was performed showing calcifications are present in the tissue
samples. Specimens with calcifications are identified for pathology.

Lesion quadrant: Upper-outer quadrant (posterior)

At the conclusion of the procedure, ribbon shaped tissue marker clip
was deployed into the biopsy cavity. Follow-up 2-view mammogram was
performed and dictated separately.

The patient and I discussed the procedure of stereotactic-guided
biopsy including benefits and alternatives. We discussed the high
likelihood of a successful procedure. We discussed the risks of the
procedure including infection, bleeding, tissue injury, clip
migration, and inadequate sampling. Informed written consent was
given. The usual time out protocol was performed immediately prior
to the procedure.

Using sterile technique and 1% lidocaine and 1% lidocaine with
epinephrine as local anesthetic, under stereotactic guidance, a 9
gauge vacuum assisted device was used to perform core needle biopsy
of calcifications in the upper-outer quadrant of the left breast
(anterior) using a lateral to medial approach. Specimen radiograph
was performed showing calcifications are difficult to see in the
specimen radiographs. A few faint calcifications may be present.
Correlation with pathology is recommended. Specimens with
calcifications are identified for pathology.

Lesion quadrant: Upper-outer quadrant (anterior)

At the conclusion of the procedure, coil shaped tissue marker clip
was deployed into the biopsy cavity. Follow-up 2-view mammogram was
performed and dictated separately.
IMPRESSION: Stereotactic-guided biopsies of the left breast. No apparent
complications.
